# Patient Record
Sex: Female | Born: 1975 | Race: Black or African American | Hispanic: No | Marital: Single | Smoking: Former smoker
Health system: Southern US, Community
[De-identification: ages and names within clinical notes are randomized; demographics above are authoritative.]

## PROBLEM LIST (undated history)

## (undated) ENCOUNTER — Ambulatory Visit (HOSPITAL_COMMUNITY): Source: Home / Self Care

## (undated) DIAGNOSIS — E669 Obesity, unspecified: Secondary | ICD-10-CM

## (undated) DIAGNOSIS — IMO0002 Reserved for concepts with insufficient information to code with codable children: Secondary | ICD-10-CM

## (undated) DIAGNOSIS — D219 Benign neoplasm of connective and other soft tissue, unspecified: Secondary | ICD-10-CM

## (undated) DIAGNOSIS — D649 Anemia, unspecified: Secondary | ICD-10-CM

## (undated) DIAGNOSIS — N920 Excessive and frequent menstruation with regular cycle: Secondary | ICD-10-CM

## (undated) HISTORY — PX: TUBAL LIGATION: SHX77

---

## 1998-01-10 ENCOUNTER — Inpatient Hospital Stay (HOSPITAL_COMMUNITY): Admission: AD | Admit: 1998-01-10 | Discharge: 1998-01-17 | Payer: Self-pay | Admitting: *Deleted

## 1998-01-20 ENCOUNTER — Inpatient Hospital Stay (HOSPITAL_COMMUNITY): Admission: EM | Admit: 1998-01-20 | Discharge: 1998-01-20 | Payer: Self-pay | Admitting: Obstetrics & Gynecology

## 1998-03-21 ENCOUNTER — Inpatient Hospital Stay (HOSPITAL_COMMUNITY): Admission: RE | Admit: 1998-03-21 | Discharge: 1998-03-21 | Payer: Self-pay | Admitting: *Deleted

## 1998-06-29 ENCOUNTER — Emergency Department (HOSPITAL_COMMUNITY): Admission: EM | Admit: 1998-06-29 | Discharge: 1998-06-29 | Payer: Self-pay | Admitting: Emergency Medicine

## 1999-07-07 ENCOUNTER — Encounter: Admission: RE | Admit: 1999-07-07 | Discharge: 1999-07-07 | Payer: Self-pay | Admitting: Obstetrics & Gynecology

## 1999-07-24 ENCOUNTER — Ambulatory Visit (HOSPITAL_COMMUNITY): Admission: RE | Admit: 1999-07-24 | Discharge: 1999-07-24 | Payer: Self-pay | Admitting: Obstetrics & Gynecology

## 1999-08-05 ENCOUNTER — Inpatient Hospital Stay (HOSPITAL_COMMUNITY): Admission: AD | Admit: 1999-08-05 | Discharge: 1999-08-05 | Payer: Self-pay | Admitting: Obstetrics

## 1999-08-06 ENCOUNTER — Encounter: Payer: Self-pay | Admitting: Obstetrics & Gynecology

## 1999-08-06 ENCOUNTER — Ambulatory Visit (HOSPITAL_COMMUNITY): Admission: RE | Admit: 1999-08-06 | Discharge: 1999-08-06 | Payer: Self-pay | Admitting: Obstetrics & Gynecology

## 1999-09-17 ENCOUNTER — Encounter: Admission: RE | Admit: 1999-09-17 | Discharge: 1999-09-17 | Payer: Self-pay | Admitting: Emergency Medicine

## 1999-09-17 ENCOUNTER — Encounter: Payer: Self-pay | Admitting: Obstetrics

## 1999-09-17 ENCOUNTER — Ambulatory Visit (HOSPITAL_COMMUNITY): Admission: RE | Admit: 1999-09-17 | Discharge: 1999-09-17 | Payer: Self-pay | Admitting: Obstetrics

## 1999-10-14 ENCOUNTER — Encounter: Admission: RE | Admit: 1999-10-14 | Discharge: 1999-10-14 | Payer: Self-pay | Admitting: Obstetrics & Gynecology

## 1999-11-05 ENCOUNTER — Ambulatory Visit (HOSPITAL_COMMUNITY): Admission: RE | Admit: 1999-11-05 | Discharge: 1999-11-05 | Payer: Self-pay | Admitting: Obstetrics & Gynecology

## 1999-12-09 ENCOUNTER — Encounter: Admission: RE | Admit: 1999-12-09 | Discharge: 1999-12-09 | Payer: Self-pay | Admitting: Obstetrics & Gynecology

## 1999-12-30 ENCOUNTER — Encounter: Admission: RE | Admit: 1999-12-30 | Discharge: 1999-12-30 | Payer: Self-pay | Admitting: Obstetrics & Gynecology

## 2000-01-19 ENCOUNTER — Inpatient Hospital Stay (HOSPITAL_COMMUNITY): Admission: AD | Admit: 2000-01-19 | Discharge: 2000-01-19 | Payer: Self-pay | Admitting: *Deleted

## 2000-01-25 ENCOUNTER — Ambulatory Visit (HOSPITAL_COMMUNITY): Admission: RE | Admit: 2000-01-25 | Discharge: 2000-01-25 | Payer: Self-pay | Admitting: Obstetrics & Gynecology

## 2000-02-01 ENCOUNTER — Inpatient Hospital Stay (HOSPITAL_COMMUNITY): Admission: AD | Admit: 2000-02-01 | Discharge: 2000-02-01 | Payer: Self-pay | Admitting: *Deleted

## 2000-02-07 ENCOUNTER — Inpatient Hospital Stay (HOSPITAL_COMMUNITY): Admission: AD | Admit: 2000-02-07 | Discharge: 2000-02-07 | Payer: Self-pay | Admitting: Obstetrics & Gynecology

## 2000-02-10 ENCOUNTER — Encounter: Admission: RE | Admit: 2000-02-10 | Discharge: 2000-02-10 | Payer: Self-pay | Admitting: Obstetrics & Gynecology

## 2000-02-17 ENCOUNTER — Ambulatory Visit (HOSPITAL_COMMUNITY): Admission: RE | Admit: 2000-02-17 | Discharge: 2000-02-17 | Payer: Self-pay | Admitting: Obstetrics & Gynecology

## 2000-02-17 ENCOUNTER — Encounter: Admission: RE | Admit: 2000-02-17 | Discharge: 2000-02-17 | Payer: Self-pay | Admitting: Obstetrics & Gynecology

## 2000-02-24 ENCOUNTER — Encounter: Admission: RE | Admit: 2000-02-24 | Discharge: 2000-02-24 | Payer: Self-pay | Admitting: Obstetrics & Gynecology

## 2000-03-09 ENCOUNTER — Encounter: Admission: RE | Admit: 2000-03-09 | Discharge: 2000-03-09 | Payer: Self-pay | Admitting: Obstetrics & Gynecology

## 2000-03-10 ENCOUNTER — Inpatient Hospital Stay (HOSPITAL_COMMUNITY): Admission: AD | Admit: 2000-03-10 | Discharge: 2000-03-10 | Payer: Self-pay | Admitting: Obstetrics & Gynecology

## 2000-03-16 ENCOUNTER — Encounter: Admission: RE | Admit: 2000-03-16 | Discharge: 2000-03-16 | Payer: Self-pay | Admitting: Obstetrics & Gynecology

## 2000-03-20 ENCOUNTER — Encounter (INDEPENDENT_AMBULATORY_CARE_PROVIDER_SITE_OTHER): Payer: Self-pay | Admitting: Specialist

## 2000-03-20 ENCOUNTER — Inpatient Hospital Stay (HOSPITAL_COMMUNITY): Admission: AD | Admit: 2000-03-20 | Discharge: 2000-03-23 | Payer: Self-pay | Admitting: Obstetrics & Gynecology

## 2000-03-27 ENCOUNTER — Inpatient Hospital Stay (HOSPITAL_COMMUNITY): Admission: AD | Admit: 2000-03-27 | Discharge: 2000-03-27 | Payer: Self-pay | Admitting: Obstetrics & Gynecology

## 2000-05-12 ENCOUNTER — Encounter: Admission: RE | Admit: 2000-05-12 | Discharge: 2000-05-12 | Payer: Self-pay | Admitting: Internal Medicine

## 2001-04-17 ENCOUNTER — Emergency Department (HOSPITAL_COMMUNITY): Admission: EM | Admit: 2001-04-17 | Discharge: 2001-04-18 | Payer: Self-pay | Admitting: Emergency Medicine

## 2001-05-08 ENCOUNTER — Emergency Department (HOSPITAL_COMMUNITY): Admission: EM | Admit: 2001-05-08 | Discharge: 2001-05-09 | Payer: Self-pay | Admitting: Emergency Medicine

## 2001-07-16 ENCOUNTER — Emergency Department (HOSPITAL_COMMUNITY): Admission: EM | Admit: 2001-07-16 | Discharge: 2001-07-16 | Payer: Self-pay | Admitting: Emergency Medicine

## 2001-12-21 ENCOUNTER — Emergency Department (HOSPITAL_COMMUNITY): Admission: EM | Admit: 2001-12-21 | Discharge: 2001-12-21 | Payer: Self-pay | Admitting: *Deleted

## 2002-01-25 ENCOUNTER — Emergency Department (HOSPITAL_COMMUNITY): Admission: EM | Admit: 2002-01-25 | Discharge: 2002-01-25 | Payer: Self-pay | Admitting: Emergency Medicine

## 2002-03-24 ENCOUNTER — Emergency Department (HOSPITAL_COMMUNITY): Admission: EM | Admit: 2002-03-24 | Discharge: 2002-03-24 | Payer: Self-pay | Admitting: Emergency Medicine

## 2002-05-21 ENCOUNTER — Emergency Department (HOSPITAL_COMMUNITY): Admission: EM | Admit: 2002-05-21 | Discharge: 2002-05-21 | Payer: Self-pay | Admitting: Emergency Medicine

## 2003-08-12 ENCOUNTER — Emergency Department (HOSPITAL_COMMUNITY): Admission: EM | Admit: 2003-08-12 | Discharge: 2003-08-12 | Payer: Self-pay | Admitting: Emergency Medicine

## 2004-12-18 ENCOUNTER — Emergency Department (HOSPITAL_COMMUNITY): Admission: EM | Admit: 2004-12-18 | Discharge: 2004-12-18 | Payer: Self-pay | Admitting: Emergency Medicine

## 2006-09-03 ENCOUNTER — Emergency Department (HOSPITAL_COMMUNITY): Admission: EM | Admit: 2006-09-03 | Discharge: 2006-09-03 | Payer: Self-pay | Admitting: Emergency Medicine

## 2006-09-13 ENCOUNTER — Emergency Department (HOSPITAL_COMMUNITY): Admission: EM | Admit: 2006-09-13 | Discharge: 2006-09-13 | Payer: Self-pay | Admitting: Emergency Medicine

## 2007-01-23 ENCOUNTER — Emergency Department (HOSPITAL_COMMUNITY): Admission: EM | Admit: 2007-01-23 | Discharge: 2007-01-23 | Payer: Self-pay | Admitting: Emergency Medicine

## 2008-09-29 ENCOUNTER — Emergency Department (HOSPITAL_COMMUNITY): Admission: EM | Admit: 2008-09-29 | Discharge: 2008-09-29 | Payer: Self-pay | Admitting: Family Medicine

## 2010-05-27 ENCOUNTER — Emergency Department (HOSPITAL_COMMUNITY): Admission: EM | Admit: 2010-05-27 | Discharge: 2010-05-27 | Payer: Self-pay | Admitting: Family Medicine

## 2011-04-02 NOTE — Op Note (Signed)
[facility]  Patient:    Jordan Pruitt, Jordan Pruitt                      MRN: [id number] Proc. Date: 03/21/00 Adm. Date:  [id number] Attending:  Antionette Char                           Operative Report  PREOPERATIVE DIAGNOSIS:       Intrauterine pregnancy at 39 weeks with spontaneous rupture of membranes.  History of previous cesarean deliveries x 3.  POSTOPERATIVE DIAGNOSIS:      Intrauterine pregnancy at 39 weeks with spontaneous rupture of membranes.  History of previous cesarean deliveries x 3.  OPERATION:                    Caryl Ada low transverse cesarean section via Pfannenstiel and modified Pomeroy bilateral tubal ligation.  SURGEON:                      Roseanna Rainbow, M.D.  ASSISTANT:                    Bing Neighbors. Clearance Coots, M.D.  ANESTHESIA:                   Spinal.  COMPLICATIONS:                None.  ESTIMATED BLOOD LOSS:         800 cc.  FLUIDS:                       1500 cc of Ringers lactate.  URINE OUTPUT:                 300 cc of clear urine at the end of the procedure.  INDICATIONS:                  The patient is a 35 year old para 3 at 39+ weeks ho presented with spontaneous rupture of membranes without labor.  FINDINGS:                     Infant in cephalic presentation.  Pediatrics present at delivery.  Weight 5 pounds 15 ounces.  Normal uterus, tubes, and ovaries.  DESCRIPTION OF PROCEDURE:     The patient was taken to the operating room where a spinal anesthetic was administered and felt to be adequate.  She was then prepped and draped in the usual sterile fashion in the dorsal supine position with a leftward tilt.  A Pfannenstiel skin incision was then made through the previous  scar with a scalpel and carried through to the underlying layer of fascia with he Bovie.  The fascia was incised in the midline and the incision extended laterally with Mayo scissors.  The superior aspect of the fascial  incision was then grasped with Kocher clamps, elevated, and the underlying rectus muscles dissected off sharply.  Attention was then turned to the inferior aspect of this incision which was manipulated in a similar fashion.  The rectus muscles were separated in the  midline.  The parietoperitoneum was identified, tented up, and entered sharply ith Metzenbaum scissors.  The peritoneal incision was then extended superiorly and inferiorly with good visualization of the bladder.  The bladder blade was then inserted and the vesicouterine peritoneum identified, grasped with pickups, and  entered sharply with  Metzenbaum scissors.  This incision was then extended laterally and the bladder flap created sharply.  The bladder blade was then reinserted and the lower uterine segment incised in a transverse fashion with the scalpel.  The uterine incision was then extended laterally with bandage scissors. The bladder blade was removed and the infants head delivered atraumatically. The nose and mouth were suctioned with the bulb suction and the cord clamped and cut. The infant was handed off to the awaiting pediatricians.  The placenta was then  removed.  The uterus was exteriorized and cleared of all clots and debris.  The  uterine incision was repaired with 0 Monocryl in a running locking fashion.  At  this point, attention was turned to the fallopian tubes.  The left fallopian tube was grasped at the isthmic portion.  Two ligatures were placed around the loop f tube approximately 2 to 3 cm.  This portion of tube was then excised.  On the right, the fallopian tube was grasped and the window was made in the mesosalpinx in the isthmic portion.  Two ligatures were then placed and a segment of tube was hen excised measuring approximately 2 to 3 cm between these two ligatures. Excellent hemostasis was noted.  The uterus was returned to the abdomen.  The gutters were cleared of all clots.  The  fascia was reapproximated with 0 PDS in a running fashion.  The skin was closed with staples.  The patient tolerated the procedure well.  Sponge, needle, and instrument counts were correct x 2.  1 gram of Cefazolin was given at cord clamp.  The patient was taken to the PACU in stable condition. DD:  03/21/00 TD:  03/21/00 Job: [id number]

## 2011-04-02 NOTE — Discharge Summary (Signed)
[place]  Patient:    Jordan Pruitt, Jordan Pruitt                      MRN: [id number] Adm. Date:  [id number] Disc. Date: [id number] Attending:  Antionette Char Dictator:   Delano Metz, M.D.                           Discharge Summary  HISTORY OF PRESENT ILLNESS:  [name] is a 35 year old African-American female G5, P3-1-1-4 with a history of a C-section x 3 due to fetal distress. She presented for repeat C-section.  HOSPITAL COURSE:  The LTCS was performed on Mar 22, 2000.  In addition to the LTCS she had a BTL.  She delivered a viable female infant without any complications.  Apgars were 8 and 9 at one and five minutes.  CONDITION ON DISCHARGE:  The patient was discharged home in good condition. DD:  04/14/00 TD:  04/18/00 Job: [id number]

## 2011-06-24 ENCOUNTER — Inpatient Hospital Stay (INDEPENDENT_AMBULATORY_CARE_PROVIDER_SITE_OTHER)
Admission: RE | Admit: 2011-06-24 | Discharge: 2011-06-24 | Disposition: A | Discharge status: Home / Self Care | Payer: Self-pay | Source: Ambulatory Visit | Attending: Emergency Medicine | Admitting: Emergency Medicine

## 2011-06-24 DIAGNOSIS — S335XXA Sprain of ligaments of lumbar spine, initial encounter: Secondary | ICD-10-CM

## 2011-12-16 ENCOUNTER — Emergency Department (INDEPENDENT_AMBULATORY_CARE_PROVIDER_SITE_OTHER)
Admission: EM | Admit: 2011-12-16 | Discharge: 2011-12-16 | Disposition: A | Discharge status: Home / Self Care | Payer: Self-pay | Source: Home / Self Care | Attending: Family Medicine | Admitting: Family Medicine

## 2011-12-16 ENCOUNTER — Encounter (HOSPITAL_COMMUNITY): Payer: Self-pay

## 2011-12-16 DIAGNOSIS — IMO0002 Reserved for concepts with insufficient information to code with codable children: Secondary | ICD-10-CM

## 2011-12-16 MED ORDER — DOXYCYCLINE HYCLATE 100 MG PO CAPS: 100.0000 mg | ORAL_CAPSULE | Freq: Two times a day (BID) | ORAL | Status: AC

## 2011-12-16 MED ORDER — HYDROCODONE-ACETAMINOPHEN 5-325 MG PO TABS: ORAL_TABLET | ORAL | Status: DC

## 2011-12-16 NOTE — ED Provider Notes (Signed)
History     CSN: [id number]  Arrival date & time 12/16/11  7829   First MD Initiated Contact with Patient 12/16/11 1028      Chief Complaint  Patient presents with  . Hand Pain    (Consider location/radiation/quality/duration/timing/severity/associated sxs/prior treatment) HPI Comments: Pain ,swelling, redness to right middle finger tip x 3 days. Reports she had a hangnail that she pulled off with nail clippers. Pain kept her from sleeping last pm  The history is provided by the patient.    History reviewed. No pertinent past medical history.  Past Surgical History  Procedure Date  . Cesarean section   . Tubal ligation     History reviewed. No pertinent family history.  History  Substance Use Topics  . Smoking status: Current Everyday Smoker  . Smokeless tobacco: Not on file  . Alcohol Use: Yes    OB History    Grav Para Term Preterm Abortions TAB SAB Ect Mult Living                  Review of Systems  Constitutional: Negative.   Cardiovascular: Negative.   Gastrointestinal: Negative.   Genitourinary: Negative.     Allergies  Review of patient's allergies indicates no known allergies.  Home Medications   Current Outpatient Rx  Name Route Sig Dispense Refill  . DOXYCYCLINE HYCLATE 100 MG PO CAPS Oral Take 1 capsule (100 mg total) by mouth 2 (two) times daily. 20 capsule 0  . HYDROCODONE-ACETAMINOPHEN 5-325 MG PO TABS  1 tab q 8 hrs prn pain 10 tablet 0    BP 132/91  Pulse 68  Temp(Src) 99 F (37.2 C) (Oral)  Resp 17  SpO2 100%  LMP 12/10/2011  Physical Exam  Nursing note and vitals reviewed. Constitutional: She appears well-developed and well-nourished.  Cardiovascular: Normal rate and regular rhythm.   Pulmonary/Chest: Effort normal and breath sounds normal.  Musculoskeletal:       Patient has a paronychia right middle finger. Opened with steril needle. Mod amt of purulent discharge expressed. Dressing applied    ED Course  Procedures  (including critical care time)  Labs Reviewed - No data to display No results found.   1. Paronychia       MDM          Randa Spike, MD 12/16/11 1054

## 2011-12-16 NOTE — ED Notes (Signed)
C/o swelling, redness, and pain to rt middle finger for 3 days.  Reports she had a hangnail that she pulled off with nail clippers.

## 2012-01-03 ENCOUNTER — Encounter (HOSPITAL_COMMUNITY): Payer: Self-pay | Admitting: *Deleted

## 2012-01-03 ENCOUNTER — Emergency Department (INDEPENDENT_AMBULATORY_CARE_PROVIDER_SITE_OTHER)
Admission: EM | Admit: 2012-01-03 | Discharge: 2012-01-03 | Disposition: A | Discharge status: Home / Self Care | Payer: Self-pay | Source: Home / Self Care

## 2012-01-03 DIAGNOSIS — L0291 Cutaneous abscess, unspecified: Secondary | ICD-10-CM

## 2012-01-03 DIAGNOSIS — L0390 Cellulitis, unspecified: Secondary | ICD-10-CM

## 2012-01-03 MED ORDER — TRAMADOL HCL 50 MG PO TABS: 50.0000 mg | ORAL_TABLET | Freq: Four times a day (QID) | ORAL | Status: AC | PRN

## 2012-01-03 MED ORDER — DOXYCYCLINE HYCLATE 100 MG PO TABS: 100.0000 mg | ORAL_TABLET | Freq: Two times a day (BID) | ORAL | Status: AC

## 2012-01-03 NOTE — Discharge Instructions (Signed)
RESOURCE GUIDE  Dental Problems  Patients with Medicaid: [facility]                     [facility] [address]                                           [address] Phone:  [phone]                                                  Phone:  [phone]  If unable to pay or uninsured, contact:  [facility] or [facility] to become qualified for the adult dental clinic.  Chronic Pain Problems Contact [facility]  [phone] Patients need to be referred by their primary care doctor.  Insufficient Money for Medicine Contact [employer]:  call "211" or [facility] 271-5999.  No Primary Care Doctor Call [facility]  832-8000 Other agencies that provide inexpensive medical care    [facility]  [phone]    [facility]  [phone]    [facility]  [phone]    Women's Clinic  832-4777    [facility]  [phone]    [facility]  [phone]  Psychological Services [facility]  [phone] [facility]  [phone] [facility]   800 853-5163 (emergency services 641-4993)  Substance Abuse Resources [facility]  336-882-2125 [facility] 336-784-9470 [facility] 336-285-9073 [facility] 336-845-3988 Residential & Outpatient Substance Abuse Program  800-659-3381  Abuse/Neglect [place] Child Abuse Hotline (336) 641-3795 [place] Child Abuse Hotline 800-378-5315 (After Hours)  Emergency Shelter [facility] (336) 271-5985  Maternity Homes [facility] (336) 275-9566 [facility] (704) 372-4663  MRSA Hotline #:   832-7006    [place] [facility]  [facility] [place]     [employer]                          [facility] [address] [place]                       [address]      [address]  [place]                                                [place]                            [place] Phone:  [phone]                                   Phone:  [phone]                 Phone:  [phone]  [facility] Phone:  [phone]    [facility] Child Abuse Hotline 289-803-4633 (214)616-1911 (After Hours)  Abscess An abscess (boil or furuncle) is an infected area that contains a collection of pus.  SYMPTOMS Signs and symptoms of an abscess include pain, tenderness, redness, or hardness. You may feel a moveable soft area under your skin. An abscess can occur anywhere in the body.  TREATMENT  A surgical cut (incision) may be made over your abscess to drain the pus. Gauze may be packed into the space or a drain may be looped through the abscess cavity (pocket). This provides a drain that will allow the cavity to heal from the inside outwards. The abscess may be painful for a few days, but should feel much better if it was drained.  Your abscess, if seen early, may not have localized and may not have been drained. If not, another appointment may be required if it does not get better on its own or with medications. HOME CARE INSTRUCTIONS   Only take over-the-counter or prescription medicines for pain, discomfort, or fever as directed by your caregiver.   Take your antibiotics as directed if they were prescribed. Finish them even if you start to feel better.   Keep the skin and clothes clean around your abscess.   If the abscess was drained, you will need to use gauze dressing to collect any draining pus. Dressings will typically need to be changed 3 or more times a day.   The infection may spread by skin contact with others. Avoid skin contact as much as possible.   Practice good hygiene. This includes regular hand washing, cover any draining skin lesions, and do not share personal care items.   If you participate in sports, do not  share athletic equipment, towels, whirlpools, or personal care items. Shower after every practice or tournament.   If a draining area cannot be adequately covered:   Do not participate in sports.   Children should not participate in day care until the wound has healed or drainage stops.   If your caregiver has given you a follow-up appointment, it is very important to keep that appointment. Not keeping the appointment could result in a much worse infection, chronic or permanent injury, pain, and disability. If there is any problem keeping the appointment, you must call back to this facility for assistance.  SEEK MEDICAL CARE IF:   You develop increased pain, swelling, redness, drainage, or bleeding in the wound site.   You develop signs of generalized infection including muscle aches, chills, fever, or a general ill feeling.   You have an oral temperature above 102 F (38.9 C).  MAKE SURE YOU:   Understand these instructions.   Will watch your condition.   Will get help right away if you are not doing well or get worse.  Document Released: 08/11/2005 Document Revised: 07/14/2011 Document Reviewed: 06/04/2008 [facility] Patient Information 2012 [place], [place].

## 2012-01-03 NOTE — ED Notes (Signed)
Noticed "boil" to left lower abd 2 days ago.  Last night area started to peel, become red, warm, and irritated.  Has been applying warm compresses, taking ASA, and Tyl PM at night.

## 2012-01-04 NOTE — Progress Notes (Signed)
Jordan Pruitt is a 36 y.o. female who presents to Urgent Care today for boil noted on Left abdominal wall for past 2 days.  Painful and tender to touch.  Has had some spontaneous drainage from site.  Has history of this in past, needing to be drained.  No fevers, chills, nausea, abdominal pain, or vomiting.     PMH reviewed.  ROS as above otherwise neg Medications reviewed. No current facility-administered medications for this encounter.   Current Outpatient Prescriptions  Medication Sig Dispense Refill  . doxycycline (VIBRA-TABS) 100 MG tablet Take 1 tablet (100 mg total) by mouth 2 (two) times daily. X 7 days  14 tablet  0  . HYDROcodone-acetaminophen (NORCO) 5-325 MG per tablet 1 tab q 8 hrs prn pain  10 tablet  0  . traMADol (ULTRAM) 50 MG tablet Take 1 tablet (50 mg total) by mouth every 6 (six) hours as needed for pain.  25 tablet  0    Exam:  BP 145/92  Pulse 96  Temp(Src) 99.2 F (37.3 C) (Oral)  Resp 18  SpO2 99%  LMP 12/18/2011 Gen: Well NAD Lungs: CTABL Nl WOB Heart: RRR no MRG Abd: NABS, NT, ND Exts: Non edematous BL  LE, warm and well perfused.  Skin:  1 cm Abscess noted LLQ abdominal wall.  Surrounding area of erythema about 5 cm in diameter.  Very TTP.  Rubor noted.    Abscess drained -- Procedure note: Informed consent given and signed copy in chart. Appropriate time out taken. Area prepped and draped in usual sterile fashion. Local anasthesia with 2% lidocaine with epi (8 cc). Small 1 cm incision made horizontally across center of abscess.  Purulent material expressed immediately, able to express more with pressure.  Hemostat used to break up loculations within wound.  Iodoform dressing packed into wound.  Area was cleaned and topical abx ointment applied. Bandage applied.  No need for suture. No complications. EBL < 5 cc.   Assessment and Plan: 1.  Abscess:  Drained in Urgent Care.  Pt tolerated procedure well.  Provided Tramadol for pain relief and Doxycycline  due to overlying skin cellulitis.  Non-diabetic, likely MRSA infection.  To FU in 5 days if recurs or no improvement.  Patient very grateful, experienced immediate relief of pain after drainage

## 2012-01-06 NOTE — ED Provider Notes (Signed)
Author:  Renold Don, MD  Service:  (none)  Author Type:  Resident   Filed:  01/04/12 2041  Note Time:  01/04/12 2010  Cosign Required:  Yes       Jordan Pruitt is a 35 y.o. female who presents to Urgent Care today for boil noted on Left abdominal wall for past 2 days. Painful and tender to touch. Has had some spontaneous drainage from site. Has history of this in past, needing to be drained. No fevers, chills, nausea, abdominal pain, or vomiting.  PMH reviewed.  ROS as above otherwise neg  Medications reviewed.    No current facility-administered medications for this encounter.    Current Outpatient Prescriptions     Medication  Sig  Dispense  Refill     .  doxycycline (VIBRA-TABS) 100 MG tablet  Take 1 tablet (100 mg total) by mouth 2 (two) times daily. X 7 days  14 tablet  0     .  HYDROcodone-acetaminophen (NORCO) 5-325 MG per tablet  1 tab q 8 hrs prn pain  10 tablet  0     .  traMADol (ULTRAM) 50 MG tablet  Take 1 tablet (50 mg total) by mouth every 6 (six) hours as needed for pain.  25 tablet  0     Exam:  BP 145/92  Pulse 96  Temp(Src) 99.2 F (37.3 C) (Oral)  Resp 18  SpO2 99%  LMP 12/18/2011  Gen: Well NAD  Lungs: CTABL Nl WOB  Heart: RRR no MRG  Abd: NABS, NT, ND  Exts: Non edematous BL LE, warm and well perfused.  Skin: 1 cm Abscess noted LLQ abdominal wall. Surrounding area of erythema about 5 cm in diameter. Very TTP. Rubor noted.  Abscess drained -- Procedure note:  Informed consent given and signed copy in chart. Appropriate time out taken. Area prepped and draped in usual sterile fashion. Local anasthesia with 2% lidocaine with epi (8 cc). Small 1 cm incision made horizontally across center of abscess. Purulent material expressed immediately, able to express more with pressure. Hemostat used to break up loculations within wound. Iodoform dressing packed into wound. Area was cleaned and topical abx ointment applied. Bandage applied. No need for suture. No  complications. EBL < 5 cc.  Assessment and Plan:  1. Abscess: Drained in Urgent Care. Pt tolerated procedure well. Provided Tramadol for pain relief and Doxycycline due to overlying skin cellulitis. Non-diabetic, likely MRSA infection. To FU in 5 days if recurs or no improvement. Patient very grateful, experienced immediate relief of pain after drainage

## 2012-01-11 NOTE — ED Provider Notes (Signed)
Medical screening examination/treatment/procedure(s) were performed by PGY-3 FM resident and as supervising physician I was immediately available for consultation/collaboration.   Sharin Grave, MD   Sharin Grave, MD 01/11/12 [phone]

## 2012-01-16 NOTE — Progress Notes (Signed)
Medical screening examination/treatment/procedure(s) were performed by resident physician and as supervising physician I was immediately available for consultation/collaboration.  Ronnie Laney, MD 

## 2012-03-11 ENCOUNTER — Encounter (HOSPITAL_COMMUNITY): Payer: Self-pay | Admitting: *Deleted

## 2012-03-11 ENCOUNTER — Emergency Department (INDEPENDENT_AMBULATORY_CARE_PROVIDER_SITE_OTHER)
Admission: EM | Admit: 2012-03-11 | Discharge: 2012-03-11 | Disposition: A | Discharge status: Home / Self Care | Payer: Self-pay | Source: Home / Self Care | Attending: Emergency Medicine | Admitting: Emergency Medicine

## 2012-03-11 DIAGNOSIS — IMO0002 Reserved for concepts with insufficient information to code with codable children: Secondary | ICD-10-CM

## 2012-03-11 HISTORY — DX: Reserved for concepts with insufficient information to code with codable children: IMO0002

## 2012-03-11 MED ORDER — TRAMADOL HCL 50 MG PO TABS: 100.0000 mg | ORAL_TABLET | Freq: Three times a day (TID) | ORAL | Status: AC | PRN

## 2012-03-11 MED ORDER — CEPHALEXIN 500 MG PO CAPS: 500.0000 mg | ORAL_CAPSULE | Freq: Three times a day (TID) | ORAL | Status: AC

## 2012-03-11 NOTE — ED Notes (Signed)
C/O swelling to cuticle area of right 4th finger x 2 days.  Has hx of paronychia.  Has not been soaking finger or applying warm compresses.

## 2012-03-11 NOTE — ED Provider Notes (Signed)
Chief Complaint  Patient presents with  . Nail Problem    History of Present Illness:   The patient is a 36 year old female who has an infected nail fold of the right ring finger for the past 3 days. She's had these before in various fingers. She attributes some to wearing acrylic nails, but she had all her acrylic nails removed. The pain and swelling is confined to the nailfold and does not extend down the finger. She is able to move her finger well and denies any fever chills.  Review of Systems:  Other than noted above, the patient denies any of the following symptoms: Systemic:  No fevers, chills, sweats, or aches.  No fatigue or tiredness. Musculoskeletal:  No joint pain, arthritis, bursitis, swelling, back pain, or neck pain. Neurological:  No muscular weakness, paresthesias, headache, or trouble with speech or coordination.  No dizziness.   PMFSH:  Past medical history, family history, social history, meds, and allergies were reviewed.  Physical Exam:   Vital signs:  BP 128/71  Pulse 73  Temp(Src) 98.5 F (36.9 C) (Oral)  Resp 18  SpO2 100%  LMP 02/28/2012 Gen:  Alert and oriented times 3.  In no distress. Musculoskeletal: The nailfold on the right ring finger has a small collection of pus. This was red and inflamed. There was no pus under the nail. No pain and swelling over the distal phalanx, PIP, DIP, or mid or proximal phalanx. She is able to bend at and straighten her finger without any difficulty. Otherwise, all joints had a full a ROM with no swelling, bruising or deformity.  No edema, pulses full. Extremities were warm and pink.  Capillary refill was brisk.  Skin:  Clear, warm and dry.  No rash. Neuro:  Alert and oriented times 3.  Muscle strength was normal.  Sensation was intact to light touch.   Procedure Note:  Verbal informed consent was obtained from the patient.  Risks and benefits were outlined with the patient.  Patient understands and accepts these risks.   Identity of the patient was confirmed verbally and by armband.    Procedure was performed as followed:  The finger was prepped with alcohol and the colonic pus was incised draining out a couple drops of pus. This was cultured. Antibiotic ointment and a Band-Aid dressing were applied the patient was instructed in wound care.  Patient tolerated the procedure well without any immediate complications.  Assessment:  The encounter diagnosis was Paronychia.  Plan:   1.  The following meds were prescribed:   New Prescriptions   CEPHALEXIN (KEFLEX) 500 MG CAPSULE    Take 1 capsule (500 mg total) by mouth 3 (three) times daily.   TRAMADOL (ULTRAM) 50 MG TABLET    Take 2 tablets (100 mg total) by mouth every 8 (eight) hours as needed for pain.   2.  The patient was instructed in symptomatic care, including rest and activity, elevation, application of ice and compression.  Appropriate handouts were given. 3.  The patient was told to return if becoming worse in any way, if no better in 3 or 4 days, and given some red flag symptoms that would indicate earlier return.   4.  The patient was told to follow up here if no better in 2 or 3 days.   Reuben Likes, MD 03/11/12 2120

## 2012-03-11 NOTE — Discharge Instructions (Signed)
Soak in warm water with Epsom salts 4 times daily for 10 to 15 minutes.  Apply antibiotic ointment and BandAid.

## 2012-03-15 LAB — CULTURE, ROUTINE-ABSCESS

## 2012-03-16 ENCOUNTER — Telehealth (HOSPITAL_COMMUNITY): Payer: Self-pay | Admitting: *Deleted

## 2012-03-16 MED ORDER — SULFAMETHOXAZOLE-TRIMETHOPRIM 800-160 MG PO TABS: 1.0000 | ORAL_TABLET | Freq: Two times a day (BID) | ORAL | Status: AC

## 2012-03-16 NOTE — ED Notes (Signed)
Abscess culture R ring finger: Mod. MRSA. Pt. treated with Keflex and had an I and D.  Lab shown to Dr. Lorenza Chick.  He said to call for clinical improvement.  I called pt. Pt. verified x 2 and given results.  Pt. said her finger is still draining and sore. NKDA. Dr. Lorenza Chick notified and he e-prescribed Septra DS BID # 20 to her pharmacy.  Pt. told to stop the Keflex and start the Septra.  Pt. given MRSA instructions and questions answered. Pt. instructed to call the pharmacy in an hour to see if it is ready. Pt. could not remember her preferred pharmacy. When I looked up the order I noted that it said #10. I clarified with Dr. Lorenza Chick. He said it pre-populated that amt. and wants me to call the pharmacy and change it to # 20.  Pharmacist @ [phone] notified. Vassie Moselle 03/16/2012

## 2013-02-07 ENCOUNTER — Emergency Department (INDEPENDENT_AMBULATORY_CARE_PROVIDER_SITE_OTHER)
Admission: EM | Admit: 2013-02-07 | Discharge: 2013-02-07 | Disposition: A | Discharge status: Home / Self Care | Payer: Self-pay | Source: Home / Self Care | Attending: Family Medicine | Admitting: Family Medicine

## 2013-02-07 ENCOUNTER — Encounter (HOSPITAL_COMMUNITY): Payer: Self-pay

## 2013-02-07 DIAGNOSIS — Z23 Encounter for immunization: Secondary | ICD-10-CM

## 2013-02-07 DIAGNOSIS — H00014 Hordeolum externum left upper eyelid: Secondary | ICD-10-CM

## 2013-02-07 DIAGNOSIS — H00019 Hordeolum externum unspecified eye, unspecified eyelid: Secondary | ICD-10-CM

## 2013-02-07 MED ORDER — CHLORHEXIDINE GLUCONATE 4 % EX LIQD: 60.0000 mL | Freq: Every day | CUTANEOUS | Status: DC | PRN

## 2013-02-07 MED ORDER — ERYTHROMYCIN 5 MG/GM OP OINT: TOPICAL_OINTMENT | Freq: Four times a day (QID) | OPHTHALMIC | Status: DC

## 2013-02-07 MED ORDER — TETANUS-DIPHTH-ACELL PERTUSSIS 5-2.5-18.5 LF-MCG/0.5 IM SUSP
0.5000 mL | Freq: Once | INTRAMUSCULAR | Status: AC
Start: 2013-02-07 — End: 2013-02-07
  Administered 2013-02-07: 0.5 mL via INTRAMUSCULAR

## 2013-02-07 MED ORDER — SULFAMETHOXAZOLE-TRIMETHOPRIM 800-160 MG PO TABS: 1.0000 | ORAL_TABLET | Freq: Two times a day (BID) | ORAL | Status: DC

## 2013-02-07 MED ORDER — TETANUS-DIPHTH-ACELL PERTUSSIS 5-2.5-18.5 LF-MCG/0.5 IM SUSP
INTRAMUSCULAR | Status: AC
  Filled 2013-02-07: qty 0.5

## 2013-02-07 NOTE — ED Notes (Signed)
I THINK I HAVE A STYE

## 2013-02-07 NOTE — ED Provider Notes (Signed)
History     CSN: [id number]  Arrival date & time 02/07/13  9562   First MD Initiated Contact with Patient 02/07/13 1038      Chief Complaint  Patient presents with  . Eye Problem    (Consider location/radiation/quality/duration/timing/severity/associated sxs/prior treatment) HPI Comments: Pt thinks she has a sty  Patient is a 37 y.o. female presenting with eye problem. The history is provided by the patient.  Eye Problem Location:  R eye Quality: pain in lump in upper eyelid. Severity:  Severe Onset quality:  Gradual Duration:  4 days Timing:  Constant Progression:  Worsening Chronicity:  New Relieved by:  Nothing Exacerbated by: palpation of lump. Ineffective treatments: constant warm compresses. Associated symptoms: no blurred vision, no discharge, no photophobia and no redness     Past Medical History  Diagnosis Date  . Paronychia     Past Surgical History  Procedure Laterality Date  . Tubal ligation    . Cesarean section      x4    History reviewed. No pertinent family history.  History  Substance Use Topics  . Smoking status: Current Every Day Smoker -- 0.50 packs/day    Types: Cigarettes  . Smokeless tobacco: Not on file  . Alcohol Use: Yes    OB History   Grav Para Term Preterm Abortions TAB SAB Ect Mult Living                  Review of Systems  Constitutional: Negative for fever and chills.  Eyes: Negative for blurred vision, photophobia, discharge and redness.       Lump in r upper eyelid that is painful, not draining    Allergies  Ibuprofen  Home Medications   Current Outpatient Rx  Name  Route  Sig  Dispense  Refill  . chlorhexidine (HIBICLENS) 4 % external liquid   Topical   Apply 60 mLs (4 application total) topically daily as needed.   473 mL   0   . erythromycin ophthalmic ointment   Right Eye   Place into the right eye every 6 (six) hours. Place a 1/2 inch ribbon of ointment into the lower eyelid.   3.5 g   0   .  sulfamethoxazole-trimethoprim (SEPTRA DS) 800-160 MG per tablet   Oral   Take 1 tablet by mouth every 12 (twelve) hours.   20 tablet   0     BP 156/69  Pulse 91  Temp(Src) 98.3 F (36.8 C) (Oral)  Resp 16  SpO2 100%  Physical Exam  Constitutional: She appears well-developed and well-nourished.  Appears uncomfortable  Eyes: Conjunctivae and EOM are normal. Pupils are equal, round, and reactive to light. Right eye exhibits hordeolum. Right eye exhibits no discharge and no exudate. Left eye exhibits no discharge, no exudate and no hordeolum.      ED Course  Procedures (including critical care time)  Labs Reviewed - No data to display No results found.   1. Hordeolum externum of left upper eyelid       MDM  Dr. Tressia Danas saw pt with me. Small pinpoint area inferior aspect of hordeolum appears ready to open and drain. Dr. Alfonse Ras used a blunted 18 gauge needle to remove superficial skin and small amount of pus drained.  Pt to continue warm compresses.  Rx bactrim and erythromycin ointment and chlorhexidene for washing skin as patient has hx skin abscesses.  Pt to f/u with opthamologist if no resolution or worsening.  Cathlyn Parsons, NP 02/07/13 1100

## 2013-02-08 NOTE — ED Provider Notes (Signed)
Medical screening examination/treatment/procedure(s) were performed by non-physician practitioner and as supervising physician I was immediately available for consultation/collaboration.   MORENO-COLL,Sadler Teschner; MD  Robyn Nohr Moreno-Coll, MD 02/08/13 1021 

## 2013-09-13 ENCOUNTER — Encounter (HOSPITAL_COMMUNITY): Payer: Self-pay | Admitting: Emergency Medicine

## 2013-09-13 ENCOUNTER — Emergency Department (HOSPITAL_COMMUNITY)
Admission: EM | Admit: 2013-09-13 | Discharge: 2013-09-13 | Disposition: A | Discharge status: Home / Self Care | Payer: Self-pay | Attending: Emergency Medicine | Admitting: Emergency Medicine

## 2013-09-13 DIAGNOSIS — L02619 Cutaneous abscess of unspecified foot: Secondary | ICD-10-CM | POA: Insufficient documentation

## 2013-09-13 DIAGNOSIS — M7989 Other specified soft tissue disorders: Secondary | ICD-10-CM

## 2013-09-13 DIAGNOSIS — F17200 Nicotine dependence, unspecified, uncomplicated: Secondary | ICD-10-CM | POA: Insufficient documentation

## 2013-09-13 DIAGNOSIS — L0291 Cutaneous abscess, unspecified: Secondary | ICD-10-CM

## 2013-09-13 MED ORDER — SULFAMETHOXAZOLE-TRIMETHOPRIM 800-160 MG PO TABS: 1.0000 | ORAL_TABLET | Freq: Two times a day (BID) | ORAL | Status: DC

## 2013-09-13 MED ORDER — HYDROCODONE-ACETAMINOPHEN 5-325 MG PO TABS: 1.0000 | ORAL_TABLET | ORAL | Status: DC | PRN

## 2013-09-13 NOTE — ED Notes (Signed)
Pt states she had an insect bite to L ankle.  Since then she is experiencing pain and swelling to L foot.

## 2013-09-13 NOTE — ED Provider Notes (Signed)
CSN: [id number]     Arrival date & time 09/13/13  1505 History   First MD Initiated Contact with Patient 09/13/13 1612     Chief Complaint  Patient presents with  . Foot Pain   (Consider location/radiation/quality/duration/timing/severity/associated sxs/prior Treatment) The history is provided by the patient and medical records.   This is a 37 year old female with no significant past medical history presenting to the ED for left foot swelling and pain. Patient states 2 days ago she noticed a small reddened area to her left dorsal foot. She thought she been bitten by an insect but does not recall seeing any insects on her. Since then has had increased swelling, pain, and warmth to touch of left dorsal foot. 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 Denies any recent fever, sweats, or chills. Denies any numbness or paresthesias of foot. Has tried elevating her foot without improvement of swelling.  Pt states she is required to wear steel toed boots at work and think this has irritated her foot even further.  Past Medical History  Diagnosis Date  . Paronychia    Past Surgical History  Procedure Laterality Date  . Tubal ligation    . Cesarean section      x4   No family history on file. History  Substance Use Topics  . Smoking status: Current Every Day Smoker -- 0.50 packs/day    Types: Cigarettes  . Smokeless tobacco: Not on file  . Alcohol Use: Yes   OB History   Grav Para Term Preterm Abortions TAB SAB Ect Mult Living                 Review of Systems  Skin:       abscess  All other systems reviewed and are negative.    Allergies  Ibuprofen  Home Medications   Current Outpatient Rx  Name  Route  Sig  Dispense  Refill  . Prenatal Vit-Fe Fumarate-FA (PRENATAL MULTIVITAMIN) TABS tablet   Oral   Take 1 tablet by mouth daily at 12 noon.         . vitamin B-12 (CYANOCOBALAMIN) 1000 MCG tablet   Oral   Take  1,000 mcg by mouth daily.          BP 147/68  Pulse 86  Temp(Src) 98.1 F (36.7 C) (Oral)  Resp 18  SpO2 100%  LMP 08/30/2013  Physical Exam  Nursing note and vitals reviewed. Constitutional: She is oriented to person, place, and time. She appears well-developed and well-nourished. No distress.  HENT:  Head: Normocephalic and atraumatic.  Mouth/Throat: Oropharynx is clear and moist.  Eyes: Conjunctivae and EOM are normal. Pupils are equal, round, and reactive to light.  Neck: Normal range of motion. Neck supple.  Cardiovascular: Normal rate, regular rhythm and normal heart sounds.   Pulmonary/Chest: Effort normal and breath sounds normal. No respiratory distress. She has no wheezes.  Musculoskeletal: Normal range of motion.       Feet:  No bites identified; Moves all toes appropriately; strong distal pulse and cap refill; sensation intact  Neurological: She is alert and oriented to person, place, and time.  Skin: Skin is warm and dry. She is not diaphoretic.  Psychiatric: She has a normal mood and affect.    ED Course  Procedures (including critical care time) Labs Review Labs Reviewed - No data to display Imaging Review No results found.  EKG Interpretation   None  MDM   1. Abscess   2. Foot swelling    Small nondraining abscess of left foot not requiring I&D at this time. Given location of abscess will start course of abx. Instructed patient to continue elevating her foot and apply warm compresses. Monitor for increased signs of infection including increased redness, warmth, and drainage. Pt does not have PCP at this time-- resource guide given.  Return precautions advised.  Garlon Hatchet, PA-C 09/13/13 [phone]

## 2013-09-14 NOTE — ED Provider Notes (Signed)
Medical screening examination/treatment/procedure(s) were performed by non-physician practitioner and as supervising physician I was immediately available for consultation/collaboration.  Maleiah Dula L Silver Parkey, MD 09/14/13 0009 

## 2013-09-18 ENCOUNTER — Encounter (HOSPITAL_COMMUNITY): Payer: Self-pay | Admitting: Emergency Medicine

## 2013-09-18 ENCOUNTER — Emergency Department (HOSPITAL_COMMUNITY)
Admission: EM | Admit: 2013-09-18 | Discharge: 2013-09-18 | Disposition: A | Discharge status: Home / Self Care | Payer: Self-pay | Attending: Emergency Medicine | Admitting: Emergency Medicine

## 2013-09-18 DIAGNOSIS — L02619 Cutaneous abscess of unspecified foot: Secondary | ICD-10-CM | POA: Insufficient documentation

## 2013-09-18 DIAGNOSIS — L02612 Cutaneous abscess of left foot: Secondary | ICD-10-CM

## 2013-09-18 DIAGNOSIS — Z79899 Other long term (current) drug therapy: Secondary | ICD-10-CM | POA: Insufficient documentation

## 2013-09-18 DIAGNOSIS — F17200 Nicotine dependence, unspecified, uncomplicated: Secondary | ICD-10-CM | POA: Insufficient documentation

## 2013-09-18 MED ORDER — SULFAMETHOXAZOLE-TRIMETHOPRIM 800-160 MG PO TABS: 1.0000 | ORAL_TABLET | Freq: Two times a day (BID) | ORAL | Status: DC

## 2013-09-18 MED ORDER — HYDROCODONE-ACETAMINOPHEN 5-325 MG PO TABS: 1.0000 | ORAL_TABLET | Freq: Four times a day (QID) | ORAL | Status: DC | PRN

## 2013-09-18 MED ORDER — CEPHALEXIN 500 MG PO CAPS: 500.0000 mg | ORAL_CAPSULE | Freq: Four times a day (QID) | ORAL | Status: DC

## 2013-09-18 NOTE — ED Notes (Signed)
Pt has abcess on the top of her left foot and it is still bothering her.  Pt is on antibiotics and she is here for recheck of this.  Pt states that the area around abcess is swollen and red.  Pt denies any fever or chills.

## 2013-09-18 NOTE — ED Notes (Signed)
MD at bedside.kirichenke PAC at bedside for treatment of LT foot.

## 2013-09-18 NOTE — ED Provider Notes (Signed)
CSN: [id number]     Arrival date & time 09/18/13  1712 History  This chart was scribed for non-physician practitioner Jaynie Crumble, PA, working with Derwood Kaplan, MD by Ronal Fear, ED scribe. This patient was seen in room TR04C/TR04C and the patient's care was started at 5:58PM.'  Chief Complaint  Patient presents with  . Foot Pain    Patient is a 37 y.o. female presenting with abscess. The history is provided by the patient. No language interpreter was used.  Abscess Location:  Foot Foot abscess location:  Top of L foot Abscess quality: painful and redness   Red streaking: yes   Duration:  5 days Progression:  Worsening Pain details:    Quality:  Throbbing   Severity:  Mild   Duration:  3 days   Timing:  Constant   Progression:  Worsening Chronicity:  Recurrent Context: skin injury   Relieved by:  Nothing Worsened by:  Nothing tried Ineffective treatments:  None tried Associated symptoms: no nausea and no vomiting   Risk factors: prior abscess   HPI Comments: Jordan Pruitt is a 37 y.o. female with a hx of abscesses who presents to the Emergency Department complaining of abscess to the top left of her foot onset 5x days ago. She was seen 5x days ago for a prior occurence to the left foot. She was given abx with some relief. She currently has two abscess, one is getting better the other is coming to a head and getting worse.  She does not appear to be in any acute distress with no other complaints.    Past Medical History  Diagnosis Date  . Paronychia    Past Surgical History  Procedure Laterality Date  . Tubal ligation    . Cesarean section      x4   No family history on file. History  Substance Use Topics  . Smoking status: Current Every Day Smoker -- 0.50 packs/day    Types: Cigarettes  . Smokeless tobacco: Not on file  . Alcohol Use: Yes   OB History   Grav Para Term Preterm Abortions TAB SAB Ect Mult Living                 Review of Systems   Gastrointestinal: Negative for nausea and vomiting.  All other systems reviewed and are negative.    Allergies  Ibuprofen  Home Medications   Current Outpatient Rx  Name  Route  Sig  Dispense  Refill  . HYDROcodone-acetaminophen (NORCO/VICODIN) 5-325 MG per tablet   Oral   Take 1 tablet by mouth every 4 (four) hours as needed for pain.   10 tablet   0   . Prenatal Vit-Fe Fumarate-FA (PRENATAL MULTIVITAMIN) TABS tablet   Oral   Take 1 tablet by mouth daily at 12 noon.         . sulfamethoxazole-trimethoprim (SEPTRA DS) 800-160 MG per tablet   Oral   Take 1 tablet by mouth every 12 (twelve) hours.   20 tablet   0   . vitamin B-12 (CYANOCOBALAMIN) 1000 MCG tablet   Oral   Take 1,000 mcg by mouth daily.          BP 142/64  Pulse 104  Temp(Src) 98.3 F (36.8 C) (Oral)  Resp 22  Ht 5\' 3"  (1.6 m)  Wt 241 lb (109.317 kg)  BMI 42.70 kg/m2  SpO2 100%  LMP 08/30/2013 Physical Exam  Nursing note and vitals reviewed. Constitutional: She appears well-developed and well-nourished.  No distress.  Eyes: Conjunctivae are normal.  Neck: Neck supple.  Musculoskeletal:  4 x 4 centimeters abscess to left dorsal foot. Area is erythematous, warm to the touch, indurated, fluctuant. No surrounding erythema or tenderness.  Neurological: She is alert.  Skin: Skin is warm and dry.    ED Course  Procedures (including critical care time) DIAGNOSTIC STUDIES: Oxygen Saturation is 100% on RA, normal by my interpretation.    COORDINATION OF CARE:    6:08 PM- Pt advised of plan for treatment and pt agrees.  Labs Review Labs Reviewed - No data to display Imaging Review No results found.  EKG Interpretation   None      INCISION AND DRAINAGE Performed by: Jaynie Crumble A Consent: Verbal consent obtained. Risks and benefits: risks, benefits and alternatives were discussed Type: abscess  Body area: dorsal left foot  Anesthesia: local infiltration  Incision was  made with a scalpel.  Local anesthetic: lidocaine 2% w epinephrine  Anesthetic total: 4 ml  Complexity: complex Blunt dissection to break up loculations  Drainage: purulent  Drainage amount: large  Packing material: 1/4 in iodoform gauze  Patient tolerance: Patient tolerated the procedure well with no immediate complications.     MDM   1. Abscess of left foot     Patient of an abscess to the left foot, states originally had 2 but now 110 and the other one is getting larger. She underwent antibiotic. There is large area of fluctuance to the dorsal foot. The area was incised and drained with very a large amount of purulent drainage. Discussed good wound care at home. We'll continue antibiotics and Keflex. She will followup with primary care Dr. here in 2 days. He is otherwise nontoxic appearing.  Filed Vitals:   09/18/13 1721  BP: 142/64  Pulse: 104  Temp: 98.3 F (36.8 C)  TempSrc: Oral  Resp: 22  Height: 5\' 3"  (1.6 m)  Weight: 241 lb (109.317 kg)  SpO2: 100%    I personally performed the services described in this documentation, which was scribed in my presence. The recorded information has been reviewed and is accurate.    Lottie Mussel, PA-C 09/19/13 [phone]

## 2013-09-24 NOTE — ED Provider Notes (Addendum)
Medical screening examination/treatment/procedure(s) were performed by non-physician practitioner and as supervising physician I was immediately available for consultation/collaboration.  EKG Interpretation   None         Derwood Kaplan, MD 09/26/13 [phone]

## 2014-03-01 ENCOUNTER — Emergency Department (HOSPITAL_COMMUNITY): Payer: Self-pay

## 2014-03-01 ENCOUNTER — Emergency Department (HOSPITAL_COMMUNITY)
Admission: EM | Admit: 2014-03-01 | Discharge: 2014-03-01 | Disposition: A | Discharge status: Home / Self Care | Payer: Self-pay | Attending: Emergency Medicine | Admitting: Emergency Medicine

## 2014-03-01 ENCOUNTER — Encounter (HOSPITAL_COMMUNITY): Payer: Self-pay | Admitting: Emergency Medicine

## 2014-03-01 DIAGNOSIS — D72829 Elevated white blood cell count, unspecified: Secondary | ICD-10-CM | POA: Insufficient documentation

## 2014-03-01 DIAGNOSIS — R509 Fever, unspecified: Secondary | ICD-10-CM | POA: Insufficient documentation

## 2014-03-01 DIAGNOSIS — M79605 Pain in left leg: Secondary | ICD-10-CM

## 2014-03-01 DIAGNOSIS — M791 Myalgia, unspecified site: Secondary | ICD-10-CM

## 2014-03-01 DIAGNOSIS — IMO0001 Reserved for inherently not codable concepts without codable children: Secondary | ICD-10-CM | POA: Insufficient documentation

## 2014-03-01 DIAGNOSIS — R05 Cough: Secondary | ICD-10-CM | POA: Insufficient documentation

## 2014-03-01 DIAGNOSIS — I10 Essential (primary) hypertension: Secondary | ICD-10-CM | POA: Insufficient documentation

## 2014-03-01 DIAGNOSIS — Z872 Personal history of diseases of the skin and subcutaneous tissue: Secondary | ICD-10-CM | POA: Insufficient documentation

## 2014-03-01 DIAGNOSIS — F17200 Nicotine dependence, unspecified, uncomplicated: Secondary | ICD-10-CM | POA: Insufficient documentation

## 2014-03-01 DIAGNOSIS — M79609 Pain in unspecified limb: Secondary | ICD-10-CM | POA: Insufficient documentation

## 2014-03-01 DIAGNOSIS — D649 Anemia, unspecified: Secondary | ICD-10-CM | POA: Insufficient documentation

## 2014-03-01 DIAGNOSIS — M79604 Pain in right leg: Secondary | ICD-10-CM

## 2014-03-01 DIAGNOSIS — R059 Cough, unspecified: Secondary | ICD-10-CM | POA: Insufficient documentation

## 2014-03-01 DIAGNOSIS — Z792 Long term (current) use of antibiotics: Secondary | ICD-10-CM | POA: Insufficient documentation

## 2014-03-01 DIAGNOSIS — Z3202 Encounter for pregnancy test, result negative: Secondary | ICD-10-CM | POA: Insufficient documentation

## 2014-03-01 LAB — URINALYSIS, ROUTINE W REFLEX MICROSCOPIC
Bilirubin Urine: NEGATIVE
GLUCOSE, UA: NEGATIVE mg/dL
KETONES UR: NEGATIVE mg/dL
LEUKOCYTES UA: NEGATIVE
Nitrite: NEGATIVE
PH: 5 (ref 5.0–8.0)
Protein, ur: NEGATIVE mg/dL
Specific Gravity, Urine: 1.009 (ref 1.005–1.030)
Urobilinogen, UA: 0.2 mg/dL (ref 0.0–1.0)

## 2014-03-01 LAB — BASIC METABOLIC PANEL
BUN: 9 mg/dL (ref 6–23)
CHLORIDE: 101 meq/L (ref 96–112)
CO2: 19 meq/L (ref 19–32)
CREATININE: 0.79 mg/dL (ref 0.50–1.10)
Calcium: 8.9 mg/dL (ref 8.4–10.5)
GFR calc Af Amer: >90 mL/min (ref >90)
GFR calc non Af Amer: >90 mL/min (ref >90)
GLUCOSE: 119 mg/dL — AB (ref 70–99)
Potassium: 4.2 mEq/L (ref 3.7–5.3)
Sodium: 134 mEq/L — ABNORMAL LOW (ref 137–147)

## 2014-03-01 LAB — CBC WITH DIFFERENTIAL/PLATELET
BASOS PCT: 0 % (ref 0–1)
Basophils Absolute: 0 10*3/uL (ref 0.0–0.1)
EOS PCT: 0 % (ref 0–5)
Eosinophils Absolute: 0 10*3/uL (ref 0.0–0.7)
HEMATOCRIT: 28.1 % — AB (ref 36.0–46.0)
HEMOGLOBIN: 8.2 g/dL — AB (ref 12.0–15.0)
LYMPHS ABS: 0.6 10*3/uL — AB (ref 0.7–4.0)
Lymphocytes Relative: 3 % — ABNORMAL LOW (ref 12–46)
MCH: 20 pg — ABNORMAL LOW (ref 26.0–34.0)
MCHC: 29.2 g/dL — ABNORMAL LOW (ref 30.0–36.0)
MCV: 68.5 fL — AB (ref 78.0–100.0)
MONOS PCT: 7 % (ref 3–12)
Monocytes Absolute: 1.3 10*3/uL — ABNORMAL HIGH (ref 0.1–1.0)
NEUTROS ABS: 17.2 10*3/uL — AB (ref 1.7–7.7)
Neutrophils Relative %: 90 % — ABNORMAL HIGH (ref 43–77)
Platelets: 263 10*3/uL (ref 150–400)
RBC: 4.1 MIL/uL (ref 3.87–5.11)
RDW: 28.4 % — ABNORMAL HIGH (ref 11.5–15.5)
WBC: 19.1 10*3/uL — AB (ref 4.0–10.5)

## 2014-03-01 LAB — POC URINE PREG, ED: Preg Test, Ur: NEGATIVE

## 2014-03-01 LAB — URINE MICROSCOPIC-ADD ON

## 2014-03-01 LAB — POC OCCULT BLOOD, ED: FECAL OCCULT BLD: POSITIVE — AB

## 2014-03-01 MED ORDER — ONDANSETRON HCL 4 MG/2ML IJ SOLN
4.0000 mg | Freq: Once | INTRAMUSCULAR | Status: AC
  Administered 2014-03-01: 4 mg via INTRAVENOUS
  Filled 2014-03-01: qty 2

## 2014-03-01 MED ORDER — ACETAMINOPHEN 325 MG PO TABS
650.0000 mg | ORAL_TABLET | Freq: Once | ORAL | Status: AC
  Administered 2014-03-01: 650 mg via ORAL
  Filled 2014-03-01: qty 2

## 2014-03-01 MED ORDER — SODIUM CHLORIDE 0.9 % IV BOLUS (SEPSIS)
1000.0000 mL | Freq: Once | INTRAVENOUS | Status: AC
  Administered 2014-03-01: 1000 mL via INTRAVENOUS

## 2014-03-01 MED ORDER — AZITHROMYCIN 250 MG PO TABS: ORAL_TABLET | ORAL | Status: DC

## 2014-03-01 MED ORDER — FENTANYL CITRATE 0.05 MG/ML IJ SOLN
100.0000 ug | Freq: Once | INTRAMUSCULAR | Status: AC
  Administered 2014-03-01: 100 ug via INTRAVENOUS
  Filled 2014-03-01: qty 2

## 2014-03-01 MED ORDER — HYDROCODONE-ACETAMINOPHEN 5-325 MG PO TABS: 1.0000 | ORAL_TABLET | ORAL | Status: DC | PRN

## 2014-03-01 NOTE — ED Provider Notes (Signed)
CSN: [id number]     Arrival date & time 03/01/14  0604 History   First MD Initiated Contact with Patient 03/01/14 [phone]     Chief Complaint  Patient presents with  . Leg Pain  . Chills     (Consider location/radiation/quality/duration/timing/severity/associated sxs/prior Treatment) HPI Jordan Pruitt Is a 38 year old female who presents the emergency department with multiple complaints.  Chiefly the patient complains of fever, body aches, chills and cough.  Patient states that one week ago she developed a persistent cough.  It is described as productive, no chest pain.  Yesterday she developed fever, chills, myalgias, decreased appetite.  She denies abdominal pain, nausea, vomiting,, diarrhea or urinary symptoms. She has no known contacts with similar symptoms.  The patient also complains of bilateral leg pain.  She states that this is severe.  She has pain that radiates down the back of the leg and into the calf on the right side and pain which wraps around from the hip to the groin and down the medial leg on the left side.  She states is not new.  She had a previous MRI many years ago but didn't denies knowing the results.  She states that this kind of pain comes and goes however it is severe and worse since last night.  She denies weakness, loss of bowel or bladder control, rashes, headache, photophobia, IV drug use, recent  procedures her back.  Past Medical History  Diagnosis Date  . Paronychia    Past Surgical History  Procedure Laterality Date  . Tubal ligation    . Cesarean section      x4   No family history on file. History  Substance Use Topics  . Smoking status: Current Every Day Smoker -- 0.50 packs/day    Types: Cigarettes  . Smokeless tobacco: Not on file  . Alcohol Use: Yes   OB History   Grav Para Term Preterm Abortions TAB SAB Ect Mult Living                 Review of Systems Ten systems reviewed and are negative for acute change, except as noted in the HPI.      Allergies  Ibuprofen  Home Medications   Prior to Admission medications   Medication Sig Start Date End Date Taking? Authorizing Provider  cephALEXin (KEFLEX) 500 MG capsule Take 1 capsule (500 mg total) by mouth 4 (four) times daily. 09/18/13   Tatyana A Kirichenko, PA-C  HYDROcodone-acetaminophen (NORCO) 5-325 MG per tablet Take 1 tablet by mouth every 6 (six) hours as needed for moderate pain. 09/18/13   Tatyana A Kirichenko, PA-C  HYDROcodone-acetaminophen (NORCO/VICODIN) 5-325 MG per tablet Take 1 tablet by mouth every 4 (four) hours as needed for pain. 09/13/13   Larene Pickett, PA-C  Prenatal Vit-Fe Fumarate-FA (PRENATAL MULTIVITAMIN) TABS tablet Take 1 tablet by mouth daily at 12 noon.    Historical Provider, MD  sulfamethoxazole-trimethoprim (SEPTRA DS) 800-160 MG per tablet Take 1 tablet by mouth every 12 (twelve) hours. 09/13/13   Larene Pickett, PA-C  sulfamethoxazole-trimethoprim (SEPTRA DS) 800-160 MG per tablet Take 1 tablet by mouth every 12 (twelve) hours. 09/18/13   Tatyana A Kirichenko, PA-C  vitamin B-12 (CYANOCOBALAMIN) 1000 MCG tablet Take 1,000 mcg by mouth daily.    Historical Provider, MD   BP 139/65  Pulse 120  Temp(Src) 101.1 F (38.4 C) (Oral)  Resp 20  Ht 5\' 3"  (1.6 m)  Wt 235 lb (106.595 kg)  BMI 41.64  kg/m2  SpO2 99%  LMP 02/28/2014 Physical Exam .Physical Exam  Nursing note and vitals reviewed. Constitutional: She is oriented to person, place, and time. She appears well-developed and well-nourished. No distress.  HENT:  Head: Normocephalic and atraumatic.  Eyes: Conjunctivae normal and EOM are normal. Pupils are equal, round, and reactive to light. No scleral icterus.  Neck: Normal range of motion.  Cardiovascular: Tachycardic, regular rhythm and normal heart sounds.  Exam reveals no gallop and no friction rub.   No murmur heard. Pulmonary/Chest: Effort normal and breath sounds normal. No respiratory distress.  Productive cough  Abdominal:  Soft. Bowel sounds are normal. She exhibits no distension and no mass. There is no tenderness. There is no guarding.  Neurological: She is alert and oriented to person, place, and time. Normal DTRs, sensation intact, positive straight leg test on the right.  Normal strength bilaterally.  Skin: Skin is warm and dry. She is not diaphoretic.    ED Course  Procedures (including critical care time) Labs Review Labs Reviewed - No data to display  Imaging Review No results found. DG Chest 2 View (Final result)  Result time: 03/01/14 07:04:14    Final result by Rad Results In Interface (03/01/14 07:04:14)    Narrative:   CLINICAL DATA: Shortness of breath  EXAM: CHEST 2 VIEW  COMPARISON: None.  FINDINGS: The heart size and mediastinal contours are within normal limits. Both lungs are clear. The visualized skeletal structures are unremarkable.  IMPRESSION: No active cardiopulmonary disease.   Electronically Signed By: Conchita Paris M.D. On: 03/01/2014 07:04          EKG Interpretation None      MDM   Final diagnoses:  None    Filed Vitals:   03/01/14 0612  BP: 139/65  Pulse: 120  Temp: 101.1 F (38.4 C)  TempSrc: Oral  Resp: 20  Height: 5\' 3"  (1.6 m)  Weight: 235 lb (106.595 kg)  SpO2: 99%    Patient with hypertension, tachycardia, febrile.  Lab work, chest x-ray, urinalysis pending. Patient's leg pain appears consistent with L3-L4, L5-S1 radiculopathy.  Feel that these are  7:58 AM BP 102/53  Pulse 104  Temp(Src) 99.9 F (37.7 C) (Oral)  Resp 20  Ht 5\' 3"  (1.6 m)  Wt 235 lb (106.595 kg)  BMI 41.64 kg/m2  SpO2 100%  LMP 02/28/2014 Patient with POS FOBT, however it is weakly positive. Patient has menses currently and was wearing a Pad which was tucked against her anus.  SHe states that she has been told previously that she has anemia and reprots  Long term ice pica, but denies Starch pica, racing heart, weakness, syncope.   Filed Vitals:    03/01/14 0612 03/01/14 0722 03/01/14 1032  BP: 139/65 102/53 111/57  Pulse: 120 104 93  Temp: 101.1 F (38.4 C) 99.9 F (37.7 C) 99.3 F (37.4 C)  TempSrc: Oral Oral Oral  Resp: 20    Height: 5\' 3"  (1.6 m)    Weight: 235 lb (106.595 kg)    SpO2: 99% 100% 100%   Patient fever resolved, States that she is feeling much better, myalgias and other sxs resolved I feel the patient has a viral infection Encouraged supportive care at home Return precautions discussed.    Margarita Mail, PA-C 03/05/14 [phone]

## 2014-03-01 NOTE — Progress Notes (Signed)
CM visited patient regarding ED & urgent care visits and no documented PCP or insurance. Discussed the importance of a PCP and patient stated that she was interested in information to obtain a PCP. Provided a resource list to patient and answered questions regarding resources listed. Edwyna Shell, RN, BSN, Case Managers 03/01/2014 9:45 AM

## 2014-03-01 NOTE — ED Notes (Signed)
Pt. reports bilateral leg pain with chills and low grade fever onset yesterday . Ambulatory / denies injury.

## 2014-03-01 NOTE — Discharge Instructions (Signed)
Do not take the norco unless you have sever pain. Do not drive, operate heavy machinery, drink alcohol, or take other tylenol containing products with this medicine.  Fever, Adult A fever is a higher than normal body temperature. In an adult, an oral temperature around 98.6 F (37 C) is considered normal. A temperature of 100.4 F (38 C) or higher is generally considered a fever. Mild or moderate fevers generally have no long-term effects and often do not require treatment. Extreme fever (greater than or equal to 106 F or 41.1 C) can cause seizures. The sweating that may occur with repeated or prolonged fever may cause dehydration. Elderly people can develop confusion during a fever. A measured temperature can vary with:  Age.  Time of day.  Method of measurement (mouth, underarm, rectal, or ear). The fever is confirmed by taking a temperature with a thermometer. Temperatures can be taken different ways. Some methods are accurate and some are not.  An oral temperature is used most commonly. Electronic thermometers are fast and accurate.  An ear temperature will only be accurate if the thermometer is positioned as recommended by the manufacturer.  A rectal temperature is accurate and done for those adults who have a condition where an oral temperature cannot be taken.  An underarm (axillary) temperature is not accurate and not recommended. Fever is a symptom, not a disease.  CAUSES   Infections commonly cause fever.  Some noninfectious causes for fever include:  Some arthritis conditions.  Some thyroid or adrenal gland conditions.  Some immune system conditions.  Some types of cancer.  A medicine reaction.  High doses of certain street drugs such as methamphetamine.  Dehydration.  Exposure to high outside or room temperatures.  Occasionally, the source of a fever cannot be determined. This is sometimes called a "fever of unknown origin" (FUO).  Some situations may  lead to a temporary rise in body temperature that may go away on its own. Examples are:  Childbirth.  Surgery.  Intense exercise. HOME CARE INSTRUCTIONS   Take appropriate medicines for fever. Follow dosing instructions carefully. If you use acetaminophen to reduce the fever, be careful to avoid taking other medicines that also contain acetaminophen. Do not take aspirin for a fever if you are younger than age 51. There is an association with Reye's syndrome. Reye's syndrome is a rare but potentially deadly disease.  If an infection is present and antibiotics have been prescribed, take them as directed. Finish them even if you start to feel better.  Rest as needed.  Maintain an adequate fluid intake. To prevent dehydration during an illness with prolonged or recurrent fever, you may need to drink extra fluid.Drink enough fluids to keep your urine clear or pale yellow.  Sponging or bathing with room temperature water may help reduce body temperature. Do not use ice water or alcohol sponge baths.  Dress comfortably, but do not over-bundle. SEEK MEDICAL CARE IF:   You are unable to keep fluids down.  You develop vomiting or diarrhea.  You are not feeling at least partly better after 3 days.  You develop new symptoms or problems. SEEK IMMEDIATE MEDICAL CARE IF:   You have shortness of breath or trouble breathing.  You develop excessive weakness.  You are dizzy or you faint.  You are extremely thirsty or you are making little or no urine.  You develop new pain that was not there before (such as in the head, neck, chest, back, or abdomen).  You have  persistant vomiting and diarrhea for more than 1 to 2 days.  You develop a stiff neck or your eyes become sensitive to light.  You develop a skin rash.  You have a fever or persistent symptoms for more than 2 to 3 days.  You have a fever and your symptoms suddenly get worse. MAKE SURE YOU:   Understand these  instructions.  Will watch your condition.  Will get help right away if you are not doing well or get worse. Document Released: 04/27/2001 Document Revised: 01/24/2012 Document Reviewed: 09/02/2011 [facility] Patient Information 2014 [place], [place].  Neuropathic Pain We often think that pain has a physical cause. If we get rid of the cause, the pain should go away. Nerves themselves can also cause pain. It is called neuropathic pain, which means nerve abnormality. It may be difficult for the patients who have it and for the treating caregivers. Pain is usually described as acute (short-lived) or chronic (long-lasting). Acute pain is related to the physical sensations caused by an injury. It can last from a few seconds to many weeks, but it usually goes away when normal healing occurs. Chronic pain lasts beyond the typical healing time. With neuropathic pain, the nerve fibers themselves may be damaged or injured. They then send incorrect signals to other pain centers. The pain you feel is real, but the cause is not easy to find.  CAUSES  Chronic pain can result from diseases, such as diabetes and shingles (an infection related to chickenpox), or from trauma, surgery, or amputation. It can also happen without any known injury or disease. The nerves are sending pain messages, even though there is no identifiable cause for such messages.   Other common causes of neuropathy include diabetes, phantom limb pain, or Regional Pain Syndrome (RPS).  As with all forms of chronic back pain, if neuropathy is not correctly treated, there can be a number of associated problems that lead to a downward cycle for the patient. These include depression, sleeplessness, feelings of fear and anxiety, limited social interaction and inability to do normal daily activities or work.  The most dramatic and mysterious example of neuropathic pain is called "phantom limb syndrome." This occurs when an arm or a leg has been removed  because of illness or injury. The brain still gets pain messages from the nerves that originally carried impulses from the missing limb. These nerves now seem to misfire and cause troubling pain.  Neuropathic pain often seems to have no cause. It responds poorly to standard pain treatment. Neuropathic pain can occur after:  Shingles (herpes zoster virus infection).  A lasting burning sensation of the skin, caused usually by injury to a peripheral nerve.  Peripheral neuropathy which is widespread nerve damage, often caused by diabetes or alcoholism.  Phantom limb pain following an amputation.  Facial nerve problems (trigeminal neuralgia).  Multiple sclerosis.  Reflex sympathetic dystrophy.  Pain which comes with cancer and cancer chemotherapy.  Entrapment neuropathy such as when pressure is put on a nerve such as in carpal tunnel syndrome.  Back, leg, and hip problems (sciatica).  Spine or back surgery.  HIV Infection or AIDS where nerves are infected by viruses. Your caregiver can explain items in the above list which may apply to you. SYMPTOMS  Characteristics of neuropathic pain are:  Severe, sharp, electric shock-like, shooting, lightening-like, knife-like.  Pins and needles sensation.  Deep burning, deep cold, or deep ache.  Persistent numbness, tingling, or weakness.  Pain resulting from light touch or  other stimulus that would not usually cause pain.  Increased sensitivity to something that would normally cause pain, such as a pinprick. Pain may persist for months or years following the healing of damaged tissues. When this happens, pain signals no longer sound an alarm about current injuries or injuries about to happen. Instead, the alarm system itself is not working correctly.  Neuropathic pain may get worse instead of better over time. For some people, it can lead to serious disability. It is important to be aware that severe injury in a limb can occur without a  proper, protective pain response.Burns, cuts, and other injuries may go unnoticed. Without proper treatment, these injuries can become infected or lead to further disability. Take any injury seriously, and consult your caregiver for treatment. DIAGNOSIS  When you have a pain with no known cause, your caregiver will probably ask some specific questions:   Do you have any other conditions, such as diabetes, shingles, multiple sclerosis, or HIV infection?  How would you describe your pain? (Neuropathic pain is often described as shooting, stabbing, burning, or searing.)  Is your pain worse at any time of the day? (Neuropathic pain is usually worse at night.)  Does the pain seem to follow a certain physical pathway?  Does the pain come from an area that has missing or injured nerves? (An example would be phantom limb pain.)  Is the pain triggered by minor things such as rubbing against the sheets at night? These questions often help define the type of pain involved. Once your caregiver knows what is happening, treatment can begin. Anticonvulsant, antidepressant drugs, and various pain relievers seem to work in some cases. If another condition, such as diabetes is involved, better management of that disorder may relieve the neuropathic pain.  TREATMENT  Neuropathic pain is frequently long-lasting and tends not to respond to treatment with narcotic type pain medication. It may respond well to other drugs such as antiseizure and antidepressant medications. Usually, neuropathic problems do not completely go away, but partial improvement is often possible with proper treatment. Your caregivers have large numbers of medications available to treat you. Do not be discouraged if you do not get immediate relief. Sometimes different medications or a combination of medications will be tried before you receive the results you are hoping for. See your caregiver if you have pain that seems to be coming from nowhere  and does not go away. Help is available.  SEEK IMMEDIATE MEDICAL CARE IF:   There is a sudden change in the quality of your pain, especially if the change is on only one side of the body.  You notice changes of the skin, such as redness, black or purple discoloration, swelling, or an ulcer.  You cannot move the affected limbs. Document Released: 07/29/2004 Document Revised: 01/24/2012 Document Reviewed: 07/29/2004 [facility] Patient Information 2014 [place].  Anemia, Nonspecific Anemia is a condition in which the concentration of red blood cells or hemoglobin in the blood is below normal. Hemoglobin is a substance in red blood cells that carries oxygen to the tissues of the body. Anemia results in not enough oxygen reaching these tissues.  CAUSES  Common causes of anemia include:   Excessive bleeding. Bleeding may be internal or external. This includes excessive bleeding from periods (in women) or from the intestine.   Poor nutrition.   Chronic kidney, thyroid, and liver disease.  Bone marrow disorders that decrease red blood cell production.  Cancer and treatments for cancer.  HIV, AIDS,  and their treatments.  Spleen problems that increase red blood cell destruction.  Blood disorders.  Excess destruction of red blood cells due to infection, medicines, and autoimmune disorders. SIGNS AND SYMPTOMS   Minor weakness.   Dizziness.   Headache.  Palpitations.   Shortness of breath, especially with exercise.   Paleness.  Cold sensitivity.  Indigestion.  Nausea.  Difficulty sleeping.  Difficulty concentrating. Symptoms may occur suddenly or they may develop slowly.  DIAGNOSIS  Additional blood tests are often needed. These help your health care provider determine the best treatment. Your health care provider will check your stool for blood and look for other causes of blood loss.  TREATMENT  Treatment varies depending on the cause of the anemia.  Treatment can include:   Supplements of iron, vitamin S28, or folic acid.   Hormone medicines.   A blood transfusion. This may be needed if blood loss is severe.   Hospitalization. This may be needed if there is significant continual blood loss.   Dietary changes.  Spleen removal. HOME CARE INSTRUCTIONS Keep all follow-up appointments. It often takes many weeks to correct anemia, and having your health care provider check on your condition and your response to treatment is very important. SEEK IMMEDIATE MEDICAL CARE IF:   You develop extreme weakness, shortness of breath, or chest pain.   You become dizzy or have trouble concentrating.  You develop heavy vaginal bleeding.   You develop a rash.   You have bloody or black, tarry stools.   You faint.   You vomit up blood.   You vomit repeatedly.   You have abdominal pain.  You have a fever or persistent symptoms for more than 2 3 days.   You have a fever and your symptoms suddenly get worse.   You are dehydrated.  MAKE SURE YOU:  Understand these instructions.  Will watch your condition.  Will get help right away if you are not doing well or get worse. Document Released: 12/09/2004 Document Revised: 07/04/2013 Document Reviewed: 04/27/2013 [facility] Patient Information 2014 [place]. Acetaminophen; Hydrocodone tablets or capsules What is this medicine? ACETAMINOPHEN; HYDROCODONE (a set a MEE noe fen; hye droe KOE done) is a pain reliever. It is used to treat mild to moderate pain. This medicine may be used for other purposes; ask your health care provider or pharmacist if you have questions. COMMON BRAND NAME(S): Anexsia, Bancap HC , Ceta-Plus, Co-Gesic, Comfortpak , Dolagesic, [employer], DuoCet , Hydrocet , Hydrogesic, Lorcet HD, Lorcet Plus, Lorcet, [place], Margesic H, Maxidone, [place], Polygesic, [place], [place], Vicodin ES, Vicodin HP, Vicodin, Charlane Ferretti What should I tell my health  care provider before I take this medicine? They need to know if you have any of these conditions: -brain tumor -Crohn's disease, inflammatory bowel disease, or ulcerative colitis -drug abuse or addiction -head injury -heart or circulation problems -if you often drink alcohol -kidney disease or problems going to the bathroom -liver disease -lung disease, asthma, or breathing problems -an unusual or allergic reaction to acetaminophen, hydrocodone, other opioid analgesics, other medicines, foods, dyes, or preservatives -pregnant or trying to get pregnant -breast-feeding How should I use this medicine? Take this medicine by mouth. Swallow it with a full glass of water. Follow the directions on the prescription label. If the medicine upsets your stomach, take the medicine with food or milk. Do not take more than you are told to take. Talk to your pediatrician regarding the use of this medicine in children. This medicine is  not approved for use in children. Overdosage: If you think you have taken too much of this medicine contact a poison control center or emergency room at once. NOTE: This medicine is only for you. Do not share this medicine with others. What if I miss a dose? If you miss a dose, take it as soon as you can. If it is almost time for your next dose, take only that dose. Do not take double or extra doses. What may interact with this medicine? -alcohol -antihistamines -isoniazid -medicines for depression, anxiety, or psychotic disturbances -medicines for sleep -muscle relaxants -naltrexone -narcotic medicines (opiates) for pain -phenobarbital -ritonavir -tramadol This list may not describe all possible interactions. Give your health care provider a list of all the medicines, herbs, non-prescription drugs, or dietary supplements you use. Also tell them if you smoke, drink alcohol, or use illegal drugs. Some items may interact with your medicine. What should I watch for while  using this medicine? Tell your doctor or health care professional if your pain does not go away, if it gets worse, or if you have new or a different type of pain. You may develop tolerance to the medicine. Tolerance means that you will need a higher dose of the medicine for pain relief. Tolerance is normal and is expected if you take the medicine for a long time. Do not suddenly stop taking your medicine because you may develop a severe reaction. Your body becomes used to the medicine. This does NOT mean you are addicted. Addiction is a behavior related to getting and using a drug for a non-medical reason. If you have pain, you have a medical reason to take pain medicine. Your doctor will tell you how much medicine to take. If your doctor wants you to stop the medicine, the dose will be slowly lowered over time to avoid any side effects. You may get drowsy or dizzy when you first start taking the medicine or change doses. Do not drive, use machinery, or do anything that may be dangerous until you know how the medicine affects you. Stand or sit up slowly. There are different types of narcotic medicines (opiates) for pain. If you take more than one type at the same time, you may have more side effects. Give your health care provider a list of all medicines you use. Your doctor will tell you how much medicine to take. Do not take more medicine than directed. Call emergency for help if you have problems breathing. The medicine will cause constipation. Try to have a bowel movement at least every 2 to 3 days. If you do not have a bowel movement for 3 days, call your doctor or health care professional. Too much acetaminophen can be very dangerous. Do not take Tylenol (acetaminophen) or medicines that contain acetaminophen with this medicine. Many non-prescription medicines contain acetaminophen. Always read the labels carefully. What side effects may I notice from receiving this medicine? Side effects that you  should report to your doctor or health care professional as soon as possible: -allergic reactions like skin rash, itching or hives, swelling of the face, lips, or tongue -breathing problems -confusion -feeling faint or lightheaded, falls -stomach pain -yellowing of the eyes or skin Side effects that usually do not require medical attention (report to your doctor or health care professional if they continue or are bothersome): -nausea, vomiting -stomach upset This list may not describe all possible side effects. Call your doctor for medical advice about side effects. You may report side  effects to FDA at 1-800-FDA-1088. Where should I keep my medicine? Keep out of the reach of children. This medicine can be abused. Keep your medicine in a safe place to protect it from theft. Do not share this medicine with anyone. Selling or giving away this medicine is dangerous and against the law. Store at room temperature between 15 and 30 degrees C (59 and 86 degrees F). Protect from light. Keep container tightly closed.  Throw away any unused medicine after the expiration date. Discard unused medicine and used packaging carefully. Pets and children can be harmed if they find used or lost packages. NOTE: This sheet is a summary. It may not cover all possible information. If you have questions about this medicine, talk to your doctor, pharmacist, or health care provider.  2014, [employer]. (2013-06-25 13:15:56)

## 2014-03-01 NOTE — ED Provider Notes (Signed)
Patient seen/examined in the Emergency Department in conjunction with Midlevel Provider [place] Patient reports fever, chills Exam : awake/alert, no distress, abd soft, no focal weakness noted in lower extremities, no signs of cellulitis to lower extremities Plan: labs pending at this time.     Sharyon Cable, MD 03/01/14 [phone]

## 2014-03-09 NOTE — ED Provider Notes (Signed)
Medical screening examination/treatment/procedure(s) were performed by non-physician practitioner and as supervising physician I was immediately available for consultation/collaboration.   EKG Interpretation None        Elyn Peers, MD 03/09/14 [phone]

## 2014-04-05 ENCOUNTER — Emergency Department (HOSPITAL_COMMUNITY)
Admission: EM | Admit: 2014-04-05 | Discharge: 2014-04-05 | Disposition: A | Discharge status: Home / Self Care | Payer: Self-pay | Attending: Emergency Medicine | Admitting: Emergency Medicine

## 2014-04-05 ENCOUNTER — Encounter (HOSPITAL_COMMUNITY): Payer: Self-pay | Admitting: Emergency Medicine

## 2014-04-05 DIAGNOSIS — Z79899 Other long term (current) drug therapy: Secondary | ICD-10-CM | POA: Insufficient documentation

## 2014-04-05 DIAGNOSIS — Z792 Long term (current) use of antibiotics: Secondary | ICD-10-CM | POA: Insufficient documentation

## 2014-04-05 DIAGNOSIS — L03119 Cellulitis of unspecified part of limb: Principal | ICD-10-CM

## 2014-04-05 DIAGNOSIS — L03115 Cellulitis of right lower limb: Secondary | ICD-10-CM

## 2014-04-05 DIAGNOSIS — M79609 Pain in unspecified limb: Secondary | ICD-10-CM

## 2014-04-05 DIAGNOSIS — F17200 Nicotine dependence, unspecified, uncomplicated: Secondary | ICD-10-CM | POA: Insufficient documentation

## 2014-04-05 DIAGNOSIS — L02419 Cutaneous abscess of limb, unspecified: Secondary | ICD-10-CM | POA: Insufficient documentation

## 2014-04-05 DIAGNOSIS — M7989 Other specified soft tissue disorders: Secondary | ICD-10-CM

## 2014-04-05 MED ORDER — HYDROCODONE-ACETAMINOPHEN 5-325 MG PO TABS: 1.0000 | ORAL_TABLET | Freq: Four times a day (QID) | ORAL | Status: DC | PRN

## 2014-04-05 MED ORDER — OXYCODONE-ACETAMINOPHEN 5-325 MG PO TABS
1.0000 | ORAL_TABLET | Freq: Once | ORAL | Status: AC
  Administered 2014-04-05: 1 via ORAL
  Filled 2014-04-05: qty 1

## 2014-04-05 MED ORDER — CLINDAMYCIN HCL 150 MG PO CAPS: 150.0000 mg | ORAL_CAPSULE | Freq: Four times a day (QID) | ORAL | Status: DC

## 2014-04-05 NOTE — ED Notes (Signed)
Rt. Leg pain from the thigh to the ankle. Pain is constant and sharp.  Pt. Reports leg turns red and is warm to touch.  This  Happened in the past, has been treated with antibiotics and pain meds.   Pt. Denies any injuries.  The pain began yesterday

## 2014-04-05 NOTE — ED Provider Notes (Signed)
CSN: [id number]     Arrival date & time 04/05/14  1047 History  This chart was scribed for non-physician practitioner, Domenic Moras, PA-C working with Blanchie Dessert, MD by Frederich Balding, ED scribe. This patient was seen in room TR09C/TR09C and the patient's care was started at 11:22 AM.   Chief Complaint  Patient presents with  . Leg Pain   The history is provided by the patient. No language interpreter was used.   HPI Comments: TIAUNA Pruitt is a 38 y.o. female who presents to the Emergency Department complaining of gradual onset, sharp right leg pain with associated swelling that started yesterday. States the pain is from her thigh to her ankle. Denies any injury. She states that her leg is sometimes red and warm to touch. States she has had a fever but taken Advil with relief. Pt has had similar symptoms in the past and was hospitalized and given IV antibiotics. Denies chest pain, trouble breathing, hemoptysis. Denies history of DVT or cancer. Denies recent surgery or long distance travel. Pt is not currently taking any hormone pills.   Past Medical History  Diagnosis Date  . Paronychia    Past Surgical History  Procedure Laterality Date  . Tubal ligation    . Cesarean section      x4   No family history on file. History  Substance Use Topics  . Smoking status: Current Every Day Smoker -- 0.50 packs/day    Types: Cigarettes  . Smokeless tobacco: Not on file  . Alcohol Use: Yes   OB History   Grav Para Term Preterm Abortions TAB SAB Ect Mult Living                 Review of Systems  Constitutional: Negative for fever.  Respiratory: Negative for cough.   Cardiovascular: Positive for leg swelling. Negative for chest pain.  Musculoskeletal: Positive for myalgias.  All other systems reviewed and are negative.  Allergies  Ibuprofen  Home Medications   Prior to Admission medications   Medication Sig Start Date End Date Taking? Authorizing Provider  azithromycin  (ZITHROMAX Z-PAK) 250 MG tablet 2 po day one, then 1 daily x 4 days 03/01/14   Margarita Mail, PA-C  ferrous sulfate 325 (65 FE) MG tablet Take 325 mg by mouth daily with breakfast.    Historical Provider, MD  Garcinia Cambogia-Chromium 500-200 MG-MCG TABS Take 2 capsules by mouth daily.    Historical Provider, MD  HYDROcodone-acetaminophen (NORCO) 5-325 MG per tablet Take 1-2 tablets by mouth every 4 (four) hours as needed. 03/01/14   Margarita Mail, PA-C  Multiple Vitamins-Minerals (MULTIVITAMIN WITH MINERALS) tablet Take 1 tablet by mouth daily.    Historical Provider, MD   BP 121/68  Pulse 88  Temp(Src) 98.3 F (36.8 C) (Oral)  Resp 18  Ht 5\' 3"  (1.6 m)  Wt 237 lb 2 oz (107.559 kg)  BMI 42.02 kg/m2  SpO2 100%  LMP 03/12/2014  Physical Exam  Nursing note and vitals reviewed. Constitutional: She is oriented to person, place, and time. She appears well-developed and well-nourished. No distress.  HENT:  Head: Normocephalic and atraumatic.  Eyes: EOM are normal.  Neck: Neck supple. No tracheal deviation present.  Cardiovascular: Normal rate, regular rhythm and normal heart sounds.   Pedal pulses intact.   Pulmonary/Chest: Effort normal and breath sounds normal. No respiratory distress. She has no wheezes. She has no rhonchi. She has no rales.  Musculoskeletal: Normal range of motion.  Right lower extremity with  large circumferential area of erythema, warmth and tenderness involving 70% of the lower leg. No area of induration or fluctuance. No obvious abscess.  Neurological: She is alert and oriented to person, place, and time.  Skin: Skin is warm and dry.  Psychiatric: She has a normal mood and affect. Her behavior is normal.    ED Course  Procedures (including critical care time)  DIAGNOSTIC STUDIES: Oxygen Saturation is 100% on RA, normal by my interpretation.    COORDINATION OF CARE: 11:25 AM-Discussed treatment plan which includes doppler study with pt at bedside and pt  agreed to plan. Advised pt that if the study is negative, she will be given antibiotics.   1:59 PM DVT study is negative.  Will treat for cellulitis with clindamycin and abx.  Pt may return if no improvement with abx.  No systemic involvement.    Labs Review Labs Reviewed - No data to display  Imaging Review No results found.   EKG Interpretation None      MDM   Final diagnoses:  Cellulitis of right leg without foot    BP 126/65  Pulse 74  Temp(Src) 98.3 F (36.8 C) (Oral)  Resp 18  Ht 5\' 3"  (1.6 m)  Wt 237 lb 2 oz (107.559 kg)  BMI 42.02 kg/m2  SpO2 100%  LMP 03/12/2014   I personally performed the services described in this documentation, which was scribed in my presence. The recorded information has been reviewed and is accurate.  Domenic Moras, PA-C 04/05/14 1400

## 2014-04-05 NOTE — ED Provider Notes (Signed)
Medical screening examination/treatment/procedure(s) were performed by non-physician practitioner and as supervising physician I was immediately available for consultation/collaboration.   EKG Interpretation None        Jannel Lynne, MD 04/05/14 1507 

## 2014-04-05 NOTE — Discharge Instructions (Signed)
Cellulitis Cellulitis is an infection of the skin and the tissue beneath it. The infected area is usually red and tender. Cellulitis occurs most often in the arms and lower legs.  CAUSES  Cellulitis is caused by bacteria that enter the skin through cracks or cuts in the skin. The most common types of bacteria that cause cellulitis are Staphylococcus and Streptococcus. SYMPTOMS   Redness and warmth.  Swelling.  Tenderness or pain.  Fever. DIAGNOSIS  Your caregiver can usually determine what is wrong based on a physical exam. Blood tests may also be done. TREATMENT  Treatment usually involves taking an antibiotic medicine. HOME CARE INSTRUCTIONS   Take your antibiotics as directed. Finish them even if you start to feel better.  Keep the infected arm or leg elevated to reduce swelling.  Apply a warm cloth to the affected area up to 4 times per day to relieve pain.  Only take over-the-counter or prescription medicines for pain, discomfort, or fever as directed by your caregiver.  Keep all follow-up appointments as directed by your caregiver. SEEK MEDICAL CARE IF:   You notice red streaks coming from the infected area.  Your red area gets larger or turns dark in color.  Your bone or joint underneath the infected area becomes painful after the skin has healed.  Your infection returns in the same area or another area.  You notice a swollen bump in the infected area.  You develop new symptoms. SEEK IMMEDIATE MEDICAL CARE IF:   You have a fever.  You feel very sleepy.  You develop vomiting or diarrhea.  You have a general ill feeling (malaise) with muscle aches and pains. MAKE SURE YOU:   Understand these instructions.  Will watch your condition.  Will get help right away if you are not doing well or get worse. Document Released: 08/11/2005 Document Revised: 05/02/2012 Document Reviewed: 01/17/2012 [employer] Patient Information 2014 [employer].  

## 2014-04-05 NOTE — Progress Notes (Signed)
VASCULAR LAB PRELIMINARY  PRELIMINARY  PRELIMINARY  PRELIMINARY  Bilateral lower extremity venous duplex completed.    Preliminary report:  *Bilateral:  No evidence of DVT, superficial thrombosis, or Baker's Cyst.   Jordan Pruitt, RVS 04/05/2014, 2:13 PM

## 2014-06-02 ENCOUNTER — Emergency Department (HOSPITAL_COMMUNITY)
Admission: EM | Admit: 2014-06-02 | Discharge: 2014-06-02 | Disposition: A | Discharge status: Home / Self Care | Payer: Self-pay | Attending: Emergency Medicine | Admitting: Emergency Medicine

## 2014-06-02 DIAGNOSIS — T148XXA Other injury of unspecified body region, initial encounter: Secondary | ICD-10-CM

## 2014-06-02 DIAGNOSIS — F17200 Nicotine dependence, unspecified, uncomplicated: Secondary | ICD-10-CM | POA: Insufficient documentation

## 2014-06-02 DIAGNOSIS — Y9289 Other specified places as the place of occurrence of the external cause: Secondary | ICD-10-CM | POA: Insufficient documentation

## 2014-06-02 DIAGNOSIS — Z79899 Other long term (current) drug therapy: Secondary | ICD-10-CM | POA: Insufficient documentation

## 2014-06-02 DIAGNOSIS — Y9389 Activity, other specified: Secondary | ICD-10-CM | POA: Insufficient documentation

## 2014-06-02 DIAGNOSIS — Z872 Personal history of diseases of the skin and subcutaneous tissue: Secondary | ICD-10-CM | POA: Insufficient documentation

## 2014-06-02 DIAGNOSIS — IMO0002 Reserved for concepts with insufficient information to code with codable children: Secondary | ICD-10-CM | POA: Insufficient documentation

## 2014-06-02 MED ORDER — TRAMADOL HCL 50 MG PO TABS
50.0000 mg | ORAL_TABLET | Freq: Once | ORAL | Status: AC
  Administered 2014-06-02: 50 mg via ORAL
  Filled 2014-06-02: qty 1

## 2014-06-02 MED ORDER — TRAMADOL HCL 50 MG PO TABS: 50.0000 mg | ORAL_TABLET | Freq: Four times a day (QID) | ORAL | Status: DC | PRN

## 2014-06-02 MED ORDER — METHOCARBAMOL 500 MG PO TABS: 500.0000 mg | ORAL_TABLET | Freq: Two times a day (BID) | ORAL | Status: DC | PRN
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
## 2014-06-02 NOTE — Discharge Instructions (Signed)
Please call your doctor for a followup appointment within 24-48 hours. When you talk to your doctor please let them know that you were seen in the emergency department and have them acquire all of your records so that they can discuss the findings with you and formulate a treatment plan to fully care for your new and ongoing problems. ° ° °Emergency Department Resource Guide °1) Find a Doctor and Pay Out of Pocket °Although you won't have to find out who is covered by your insurance plan, it is a good idea to ask around and get recommendations. You will then need to call the office and see if the doctor you have chosen will accept you as a new patient and what types of options they offer for patients who are self-pay. Some doctors offer discounts or will set up payment plans for their patients who do not have insurance, but you will need to ask so you aren't surprised when you get to your appointment. ° °2) Contact Your Local Health Department °Not all health departments have doctors that can see patients for sick visits, but many do, so it is worth a call to see if yours does. If you don't know where your local health department is, you can check in your phone book. The CDC also has a tool to help you locate your state's health department, and many state websites also have listings of all of their local health departments. ° °3) Find a Walk-in Clinic °If your illness is not likely to be very severe or complicated, you may want to try a walk in clinic. These are popping up all over the country in pharmacies, drugstores, and shopping centers. They're usually staffed by nurse practitioners or physician assistants that have been trained to treat common illnesses and complaints. They're usually fairly quick and inexpensive. However, if you have serious medical issues or chronic medical problems, these are probably not your best option. ° °No Primary Care Doctor: °- Call Health Connect at  832-8000 - they can help you  locate a primary care doctor that  accepts your insurance, provides certain services, etc. °- Physician Referral Service- 1-800-533-3463 ° °Chronic Pain Problems: °Organization         Address  Phone   Notes  °[facility]  ([phone] Patients need to be referred by their primary care doctor.  ° °Medication Assistance: °Organization         Address  Phone   Notes  °[place] [facility] [address] °[place], [place] [address] (336) 641-8030 --Must be a resident of [place] °-- Must have NO insurance coverage whatsoever (no Medicaid/ Medicare, etc.) °-- The pt. MUST have a primary care doctor that directs their care regularly and follows them in the community °  °[facility]ist  (866) 331-1348   °[employer]  (888) 892-1162   ° °Agencies that provide inexpensive medical care: °Organization         Address  Phone   Notes  °[facility]  ([phone]) [phone]   °[facility]    (336) 832-7272   °[facility] [address] °[place], [place] [address] (336) [phone]   °[facility] [address], °[place] (336) [phone]   °Planned Parenthood    (336) 373-0678   °[facility]    (336) 272-1050   °[facility] ° [address], [place] Phone:  (336)   336) [phone], Fax:  ([phone] Hours of Operation:  9 am - 6 pm, M-F.  Also accepts Medicaid/Medicare and self-pay.  [facility] for [place], [address], [place] Phone: [phone], Fax: [phone]. Hours of Operation:  8:30 am - 5:30 pm, M-F.  Also accepts Medicaid and self-pay.  [facility] [place] [address] [place] Phone: [phone]   [place], [place], [place] [phone], Ext. [phone] Mondays & Thursdays: 7-9 AM.  First 15 patients are seen on a first come, first serve basis.    [place]  Providers:  Organization         Address  Phone   Notes  [facility] [address], [place] [phone] Also accepts self-pay patients.  [facility] [address], [place]  [phone]   [place], [address], [place] [phone]   [facility] [address], [place] [phone]   Lucianne Lei [address], [place]   [phone] Only accepts [place] Access [place] patients after they have their name applied to their card.   Self-Pay (no insurance) in [facility]:  Organization         Address  Phone   Notes  Sickle Cell Patients, [facility] [place] [phone]   [facility] [place] [phone]   [facility] [place]  [place], [place], [place] [phone]   [facility]/Dr. Osei-Bonsu  [address] [place] or [address], [place] [phone] Phone number for both [place] and [place] locations is the same.  [facility] [address], [place] [phone]   [facility] [address] [place] or [address] [phone]   [facility] [address] [place] [phone], phone; [phone], fax Sees patients 1st and 3rd Saturday of every month.  Must not qualify for public or private insurance (i.e. Medicaid, Medicare, [place] Health Choice, Veterans' Benefits)  Household income should be no more than 200% of the poverty level The clinic cannot treat you if you are pregnant or think you are pregnant  Sexually transmitted diseases are not treated at the clinic.    Dental Care: Organization         Address  Phone  Notes  [facility] of [facility] [phone] Accepts children up to age 2 who are enrolled in [place] or [place]; pregnant women with a Medicaid card; and children who have applied for Medicaid or [place] Health Choice, but were declined, whose parents can pay a reduced fee at time of service.  [facility] Department of [facility]  [address], [place] [phone] Accepts children up to age 72 who are enrolled in [place] or [place]; pregnant women with a Medicaid card; and children who have applied for Medicaid or [place] Health Choice, but were declined, whose parents can pay a reduced fee at time of service.  [facility]  [place] [phone] Patients are seen by appointment only. Walk-ins  are not accepted. [place] will see patients 76 years of age and older. Monday - Tuesday (8am-5pm) Most Wednesdays (8:30-5pm) $30 per visit, cash only  [facility]  [address], [facility] [phone] Patients are seen by appointment only. Walk-ins are not accepted. [place] will see patients 32 years of age and older. One Wednesday Evening (Monthly: Volunteer Based).  $30 per visit, cash only  [place]  [phone] for adults; Children under age 64, call Graduate Pediatric Dentistry at [phone]. Children aged 51-14, please call [phone] to request a pediatric application.  Dental services are provided in all areas of dental care including fillings, crowns and bridges, complete and partial dentures, implants, gum treatment, root canals, and extractions. Preventive care is also provided. Treatment is provided to both adults and children. Patients are selected via a lottery and there is often a waiting list.   [facility] [address] [place]  [phone] [email or web address]   [facility]  [address] [place], [place] [phone], Ext. [phone] Second and Fourth Thursday of each month, opens at 6:30 AM; Clinic ends at 9 AM.  Patients are seen on a first-come first-served basis, and a limited number are seen during each clinic.   [facility]  [address] Hillard Danker [place], [place] [phone]   Eligibility Requirements You must have lived in [place], [place], or [place] counties for at least the last three months.   You cannot be eligible for state or federal sponsored [employer], including [employer], [place], or [employer].   You generally cannot be eligible for healthcare insurance through your employer.    How to apply: Eligibility screenings are held every Tuesday and Wednesday afternoon from 1:00 pm until 4:00 pm. You do not need an appointment for the interview!  [facility] [address], [place], [place]   [place]  [place] Department  [place]  [phone]    Behavioral Health Resources in the Community: Intensive Outpatient Programs Organization         Address  Phone  Notes  [place]. [address], [place], [place] [phone]   [facility]patient [address], [place], [place]   [facility]: [facility] [address], [place], [place]   [place] [address]  [place], [place] or [phone]   Substance Abuse Resources Organization         Address  Phone  Notes  [facility] Drug Services  [phone]   [place]  [phone]   [facility] [place]   Chinita Pester  [phone]   Residential & Outpatient Substance Abuse Program  (712)443-1684   Psychological Services Organization         Address  Phone  Notes  [facility] [place]  [place]  ([phone]   [place] [address], [place] [phone] or [phone]    Mobile Crisis Teams Organization         Address  Phone  Notes  [facility], Mobile Crisis Care Unit  ([phone]   [facility]  [address] [place], [place]   [facility] [address] [place] [phone]  Self-Help/Support Groups Organization         Address  Phone             Notes  [facility] [place] - variety of support groups  [place] Call for more information  Narcotics Anonymous (NA), [facility] [address], [employer] [place]  2 meetings at this location   Special educational needs teacher         Address  Phone  Notes  [facility] Residential Treatment [place],    [place]  [phone]   [facility]  [address], [place] [phone], [place], [place]   [place], [place] [phone] Admissions: 8am-3pm M-F  Incentives Substance [place] [address]    [place], [place] [phone]   [facility] [address] [place], [place], [place]   [facility] [address]  [place], [place]   [facility] - Intensive Outpatient [address] Kristeen [address], [place], [place]   [facility] ([place] [address]  [place], [place] [phone] or [phone]   [facility] ([facility]) [address], [place], [place] Accepts Medicaid  [facility] [address],  [place] [phone] Substance Abuse/Addiction Treatment   [facility] Organization         Address  Phone  Notes  [facility]  [phone]   Domenic Schwab, PhD [address] Arlis Porta [place], [place]   [phone] or [phone]    [place], [place] [phone]   [facility] [address], [place], [place] [phone] Insurance/Medicaid/sponsorship through [facility] [address], [address]                                    [place], [place] [phone] [place] [address] [place] [address], [place] [phone]    Dr. Adele Schilder  [phone]   [facility] Dept. 1) [address] [place] 2) [place] 3)  [place] [address], [place] [phone]  [phone]   [place] [phone] or [phone] (After Hours)
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
## 2014-06-03 ENCOUNTER — Emergency Department (HOSPITAL_COMMUNITY)
Admission: EM | Admit: 2014-06-03 | Discharge: 2014-06-03 | Disposition: A | Discharge status: Home / Self Care | Payer: No Typology Code available for payment source | Attending: Emergency Medicine | Admitting: Emergency Medicine

## 2014-06-03 ENCOUNTER — Encounter (HOSPITAL_COMMUNITY): Payer: Self-pay | Admitting: Emergency Medicine

## 2014-06-03 DIAGNOSIS — Z872 Personal history of diseases of the skin and subcutaneous tissue: Secondary | ICD-10-CM | POA: Insufficient documentation

## 2014-06-03 DIAGNOSIS — R110 Nausea: Secondary | ICD-10-CM | POA: Insufficient documentation

## 2014-06-03 DIAGNOSIS — Z79899 Other long term (current) drug therapy: Secondary | ICD-10-CM | POA: Insufficient documentation

## 2014-06-03 DIAGNOSIS — M5441 Lumbago with sciatica, right side: Secondary | ICD-10-CM

## 2014-06-03 DIAGNOSIS — M5430 Sciatica, unspecified side: Secondary | ICD-10-CM | POA: Insufficient documentation

## 2014-06-03 DIAGNOSIS — F17200 Nicotine dependence, unspecified, uncomplicated: Secondary | ICD-10-CM | POA: Insufficient documentation

## 2014-06-03 DIAGNOSIS — M25559 Pain in unspecified hip: Secondary | ICD-10-CM | POA: Insufficient documentation

## 2014-06-03 MED ORDER — HYDROCODONE-ACETAMINOPHEN 5-325 MG PO TABS: 1.0000 | ORAL_TABLET | ORAL | Status: DC | PRN
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
## 2014-06-03 NOTE — Discharge Instructions (Signed)
Sciatica Sciatica is pain, weakness, numbness, or tingling along the path of the sciatic nerve. The nerve starts in the lower back and runs down the back of each leg. The nerve controls the muscles in the lower leg and in the back of the knee, while also providing sensation to the back of the thigh, lower leg, and the sole of your foot. Sciatica is a symptom of another medical condition. For instance, nerve damage or certain conditions, such as a herniated disk or bone spur on the spine, pinch or put pressure on the sciatic nerve. This causes the pain, weakness, or other sensations normally associated with sciatica. Generally, sciatica only affects one side of the body. CAUSES   Herniated or slipped disc.  Degenerative disk disease.  A pain disorder involving the narrow muscle in the buttocks (piriformis syndrome).  Pelvic injury or fracture.  Pregnancy.  Tumor (rare). SYMPTOMS  Symptoms can vary from mild to very severe. The symptoms usually travel from the low back to the buttocks and down the back of the leg. Symptoms can include:  Mild tingling or dull aches in the lower back, leg, or hip.  Numbness in the back of the calf or sole of the foot.  Burning sensations in the lower back, leg, or hip.  Sharp pains in the lower back, leg, or hip.  Leg weakness.  Severe back pain inhibiting movement. These symptoms may get worse with coughing, sneezing, laughing, or prolonged sitting or standing. Also, being overweight may worsen symptoms. DIAGNOSIS  Your caregiver will perform a physical exam to look for common symptoms of sciatica. He or she may ask you to do certain movements or activities that would trigger sciatic nerve pain. Other tests may be performed to find the cause of the sciatica. These may include:  Blood tests.  X-rays.  Imaging tests, such as an MRI or CT scan. TREATMENT  Treatment is directed at the cause of the sciatic pain. Sometimes, treatment is not necessary  and the pain and discomfort goes away on its own. If treatment is needed, your caregiver may suggest:  Over-the-counter medicines to relieve pain.  Prescription medicines, such as anti-inflammatory medicine, muscle relaxants, or narcotics.  Applying heat or ice to the painful area.  Steroid injections to lessen pain, irritation, and inflammation around the nerve.  Reducing activity during periods of pain.  Exercising and stretching to strengthen your abdomen and improve flexibility of your spine. Your caregiver may suggest losing weight if the extra weight makes the back pain worse.  Physical therapy.  Surgery to eliminate what is pressing or pinching the nerve, such as a bone spur or part of a herniated disk. HOME CARE INSTRUCTIONS   Only take over-the-counter or prescription medicines for pain or discomfort as directed by your caregiver.  Apply ice to the affected area for 20 minutes, 3-4 times a day for the first 48-72 hours. Then try heat in the same way.  Exercise, stretch, or perform your usual activities if these do not aggravate your pain.  Attend physical therapy sessions as directed by your caregiver.  Keep all follow-up appointments as directed by your caregiver.  Do not wear high heels or shoes that do not provide proper support.  Check your mattress to see if it is too soft. A firm mattress may lessen your pain and discomfort. SEEK IMMEDIATE MEDICAL CARE IF:   You lose control of your bowel or bladder (incontinence).  You have increasing weakness in the lower back, pelvis, buttocks,   or legs.  You have redness or swelling of your back.  You have a burning sensation when you urinate.  You have pain that gets worse when you lie down or awakens you at night.  Your pain is worse than you have experienced in the past.  Your pain is lasting longer than 4 weeks.  You are suddenly losing weight without reason. MAKE SURE YOU:  Understand these  instructions.  Will watch your condition.  Will get help right away if you are not doing well or get worse. Document Released: 10/26/2001 Document Revised: 05/02/2012 Document Reviewed: 03/12/2012 ExitCare Patient Information 2015 [employer]. This information is not intended to replace advice given to you by your health care provider. Make sure you discuss any questions you have with your health care provider.  
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
## 2014-06-06 NOTE — ED Provider Notes (Signed)
Medical screening examination/treatment/procedure(s) were performed by non-physician practitioner and as supervising physician I was immediately available for consultation/collaboration.   Neta Ehlers, MD 06/06/14 [phone]

## 2014-06-26 ENCOUNTER — Emergency Department (HOSPITAL_COMMUNITY): Payer: Self-pay

## 2014-06-26 ENCOUNTER — Emergency Department (HOSPITAL_COMMUNITY)
Admission: EM | Admit: 2014-06-26 | Discharge: 2014-06-26 | Disposition: A | Discharge status: Home / Self Care | Payer: Self-pay | Attending: Emergency Medicine | Admitting: Emergency Medicine

## 2014-06-26 ENCOUNTER — Encounter (HOSPITAL_COMMUNITY): Payer: Self-pay | Admitting: Emergency Medicine

## 2014-06-26 DIAGNOSIS — Z872 Personal history of diseases of the skin and subcutaneous tissue: Secondary | ICD-10-CM | POA: Insufficient documentation

## 2014-06-26 DIAGNOSIS — R05 Cough: Secondary | ICD-10-CM | POA: Insufficient documentation

## 2014-06-26 DIAGNOSIS — Z79899 Other long term (current) drug therapy: Secondary | ICD-10-CM | POA: Insufficient documentation

## 2014-06-26 DIAGNOSIS — J069 Acute upper respiratory infection, unspecified: Secondary | ICD-10-CM | POA: Insufficient documentation

## 2014-06-26 DIAGNOSIS — F17200 Nicotine dependence, unspecified, uncomplicated: Secondary | ICD-10-CM | POA: Insufficient documentation

## 2014-06-26 DIAGNOSIS — R059 Cough, unspecified: Secondary | ICD-10-CM | POA: Insufficient documentation

## 2014-06-26 DIAGNOSIS — IMO0001 Reserved for inherently not codable concepts without codable children: Secondary | ICD-10-CM | POA: Insufficient documentation

## 2014-06-26 DIAGNOSIS — R197 Diarrhea, unspecified: Secondary | ICD-10-CM | POA: Insufficient documentation

## 2014-06-26 MED ORDER — ACETAMINOPHEN 325 MG PO TABS
650.0000 mg | ORAL_TABLET | Freq: Once | ORAL | Status: AC
  Administered 2014-06-26: 650 mg via ORAL
  Filled 2014-06-26: qty 2

## 2014-06-26 MED ORDER — BENZONATATE 100 MG PO CAPS: 100.0000 mg | ORAL_CAPSULE | Freq: Three times a day (TID) | ORAL | Status: DC

## 2014-06-26 NOTE — Discharge Instructions (Signed)
°Emergency Department Resource Guide °1) Find a Doctor and Pay Out of Pocket °Although you won't have to find out who is covered by your insurance plan, it is a good idea to ask around and get recommendations. You will then need to call the office and see if the doctor you have chosen will accept you as a new patient and what types of options they offer for patients who are self-pay. Some doctors offer discounts or will set up payment plans for their patients who do not have insurance, but you will need to ask so you aren't surprised when you get to your appointment. ° °2) Contact Your Local Health Department °Not all health departments have doctors that can see patients for sick visits, but many do, so it is worth a call to see if yours does. If you don't know where your local health department is, you can check in your phone book. The CDC also has a tool to help you locate your state's health department, and many state websites also have listings of all of their local health departments. ° °3) Find a Walk-in Clinic °If your illness is not likely to be very severe or complicated, you may want to try a walk in clinic. These are popping up all over the country in pharmacies, drugstores, and shopping centers. They're usually staffed by nurse practitioners or physician assistants that have been trained to treat common illnesses and complaints. They're usually fairly quick and inexpensive. However, if you have serious medical issues or chronic medical problems, these are probably not your best option. ° °No Primary Care Doctor: °- Call [facility] Connect at  832-8000 - they can help you locate a primary care doctor that  accepts your insurance, provides certain services, etc. °- Physician Referral Service- 1-800-533-3463 ° °Chronic Pain Problems: °Organization         Address  Phone   Notes  °[facility]  (336) 297-2271 Patients need to be referred by their primary care doctor.  ° °Medication  Assistance: °Organization         Address  Phone   Notes  °[place] [facility] [address] °[place], [place] [address] (336) 641-8030 --Must be a resident of [place] °-- Must have NO insurance coverage whatsoever (no Medicaid/ Medicare, etc.) °-- The pt. MUST have a primary care doctor that directs their care regularly and follows them in the community °  °[facility]  (866) 331-1348   °[employer]  (888) 892-1162   ° °Agencies that provide inexpensive medical care: °Organization         Address  Phone   Notes  °[facility]  (336) 832-8035   °[facility]    (336) 832-7272   °[facility] [address] °[place],  [address] (336) 832-4777   °[facility] [address], °[employer] [place] (336) 271-4999   °[facility]    (336) 373-0678   °[facility]    (336) 272-1050   °[facility] ° [address], [place] Phone:  (336) 832-4444, Fax:  (336) 832-4440 Hours of Operation:  9 am - 6 pm, M-F.  Also accepts Medicaid/Medicare and self-pay.  °[facility] ° [address], [place] Phone: (336) 832-3150, Fax: (336) [phone]. Hours of Operation:  8:30 am - 5:30 pm, M-F.  Also accepts Medicaid and self-pay.  °[facility] [place] [address], [place] Phone: (336) 878-6027   °[facility] [address], [place], [place] (336)723-1848, Ext. 123 Mondays & Thursdays: 7-9 AM.  First 15 patients are seen on a first come, first serve basis. °  ° °Medicaid-accepting [place] Providers: ° °Organization         Address  Phone   Notes  °[facility] [address], [place] (336) 641-2100 Also accepts self-pay patients.  °[facility] [address], [place] ° (336) 856-9996   °[facility] [address], [place]  (336) 288-8857   °[facility] [address] [place] [address], [place] (336) 299-7000   °Veita Bland [address], [place]  ° (336) 373-1557 Only accepts [place] Access Medicaid patients after they have their name applied to their card.  ° °Self-Pay (no insurance) in [place]: ° °Organization         Address  Phone   Notes  °Sickle Cell Patients, [facility] [address], [place] (336) 832-1970   °[facility] [address], [place] (336) 832-4400   °[facility] ° [address], [place] (336) 992-4800   °[facility]/Dr. Osei-Bonsu ° [address] [place] [address], [place] or [address], [place] (336) 841-8500 Phone number for both [place] and [place] locations is the same.  °[facility] [address], [place] (336) 299-0000   °[facility] [address] [place] [address], [place] or [address] (336) 852-7530 °(336) 878-2260   °[facility] [address], [place] (336) 350-1642, phone; (336) 294-5005, fax Sees patients 1st and 3rd Saturday of every month.  Must not qualify for public or private insurance (i.e. Medicaid, Medicare, [place] Health Choice, Veterans' Benefits) • Household income should be no more than 200% of the poverty level •The clinic cannot treat you if you are pregnant or think you are pregnant • Sexually transmitted diseases are not treated at the clinic.  ° ° °Dental Care: °Organization         Address  Phone  Notes  °[place] [facility] [address],  (336) 641-6152 Accepts children up to age 21 who are enrolled in Medicaid or [place] Health Choice; pregnant women with a Medicaid card; and children who have applied for Medicaid or [place] Health Choice, but were declined, whose parents can pay a reduced fee at time of service.  °[place]  [facility] [place]  [address], [place] (336) 641-7733 Accepts children up to age 21 who are enrolled in Medicaid or [place] Health Choice; pregnant women with a Medicaid card; and children who have applied for Medicaid or  Health Choice, but were declined, whose parents can pay a reduced fee at time of service.  °[facility] ° [address],  (336) 641-4533 Patients are seen by appointment only. Walk-ins are not accepted. [facility] will see patients 18 years of age and older. °Monday - Tuesday (8am-5pm) °Most Wednesdays (8:30-5pm) °$30 per visit, cash only  °[facility] ° [address], [place] (336) 641-4533 Patients are seen by appointment only. Walk-ins are not accepted. [facility] will see patients 18 years of age and older. °One   Wednesday Evening (Monthly: Volunteer Based).  $30 per visit, cash only  °[facility]  (919) 537-3737 for adults; Children under age 4, call Graduate Pediatric Dentistry at ([phone]) [phone]. Children aged 4-14, please call ([phone]) [phone] to request a pediatric application. ° Dental services are provided in all areas of dental care including fillings, crowns and bridges, complete and partial dentures, implants, gum treatment, root canals, and extractions. Preventive care is also provided. Treatment is provided to both adults and children. °Patients are selected via a lottery and there is often a waiting list. °  °[facility] [address], °[place] ° (336) 763-8833 [email or web address] °  °[facility] [address], [place], [place] (336)723-1848, Ext. 123 Second and Fourth Thursday of each month, opens at 6:30 AM; Clinic ends at 9 AM.  Patients are seen on a first-come first-served basis, and a limited number are seen during each clinic.  ° °[facility] ° [address], [place], [place] (336) 723-7904    Eligibility Requirements °You must have lived in [place], [place], or [place] counties for at least the last three months. °  You cannot be eligible for state or federal sponsored healthcare insurance, including Veterans Administration, Medicaid, or Medicare. °  You generally cannot be eligible for healthcare insurance through your employer.  °  How to apply: °Eligibility screenings are held every Tuesday and Wednesday afternoon from 1:00 pm until 4:00 pm. You do not need an appointment for the interview!  °[facility] [address], [place], [place] 336-631-2330   °[facility]  336-342-8273   °[facility]  336-703-3100   °[facility]  336-570-6415   ° °Behavioral Health Resources in the Community: °Intensive Outpatient Programs °Organization         Address  Phone  Notes  °[facility] [address], [place], [place] 336-878-6098   °[facility] [address], [place], [place] 336-832-9800   °[facility] [address], [place], [place] ° 336-882-2125   °[facility] [address],  °[place], [place] 1-800-853-5163 or 336-641-4981   °Substance Abuse Resources °Organization         Address  Phone  Notes  °[facility]  336-882-2125   °[facility]  336-784-9470   °[facility]  336-285-9073   °[facility]  336-845-3988   °Residential & Outpatient Substance Abuse Program  1-800-659-3381   °Psychological Services °Organization         Address  Phone  Notes  ° Health  336- 832-9600   °[facility]  336- 378-7881   °[facility] [address], [place] 1-800-853-5163 or 336-641-4981   ° °Mobile Crisis Teams °Organization         Address  Phone  Notes  °[facility], Mobile Crisis Care Unit  1-877-626-1772   °Assertive °[facility] ° [address]  [place], [place] 336-834-9664   °Sharon DeEsch [address] °[place] 336-554-5454   ° °Self-Help/Support Groups °Organization         Address  Phone             Notes  °[facility] - variety of support groups  336- 373-1402 Call for more information  °Narcotics Anonymous (NA), [facility] [address], °[place]  2 meetings at this location  ° °  Residential Treatment Programs Organization         Address  Phone  Notes  [facility] [address]    [place]  1-(989)194-1233   [facility]  [address], [place] [address], [place], [place]   [place], [place] 717-430-8485 Admissions: 8am-3pm M-F  [facility] [address],    [place], [place] [phone]   [facility] [address] [place], [place], [place]   [facility] [address]  [place], [place]   [facility] - Intensive Outpatient [address], [place], [place]   [facility] ([place] [address]  [place], [place] 1-810-498-4632 or [phone]   [facility] [address], [place], [place] Accepts Medicaid  [facility] [address]  [place] 1-(978)291-6806 Substance Abuse/Addiction Treatment   [facility]         Address  Phone  Notes  [facility]  (667) 258-9109   Domenic Schwab, PhD [address] [place], [place]   367-041-8684 or (403) 291-6503   [facility] [place], [place] 718-614-8364   [facility] [address] [place], [place] (919) [phone] Insurance/Medicaid/sponsorship through [facility] [address]                                    [place], [place] [phone] [facility] [address], [place] ([phone]    Dr. Adele Schilder  [phone]   [facility] Dept. 1) [address] [place] 2) [place] 3)  [address], [place] [phone]  [phone]   [place] (434)[phone] or [phone] (After Hours)       Upper Respiratory Infection, Adult An upper respiratory infection (URI) is also sometimes known as the common cold. The upper respiratory tract includes the nose, sinuses, throat, trachea, and bronchi. Bronchi are the airways leading to the lungs. Most people improve within 1 week, but symptoms can last up to 2 weeks. A residual cough may last even longer.  CAUSES Many different viruses can infect the tissues lining the upper respiratory tract. The tissues become irritated and inflamed and often become very moist. Mucus production is also common. A cold is contagious. You can easily spread the virus to others by oral contact. This includes kissing, sharing a glass, coughing, or sneezing. Touching your mouth or nose and then touching a surface, which is then touched by another person, can also spread the virus. SYMPTOMS  Symptoms typically develop 1 to 3 days after you come in contact with a cold virus. Symptoms vary from person to person. They may include:  Runny nose.  Sneezing.  Nasal congestion.  Sinus irritation.  Sore throat.  Loss of voice (laryngitis).  Cough.  Fatigue.  Muscle aches.  Loss of appetite.  Headache.  Low-grade fever. DIAGNOSIS  You might diagnose your own cold based on familiar symptoms, since most people get a cold 2 to 3 times a year. Your caregiver can confirm this based on your exam. Most importantly,  your caregiver can check that your symptoms are not due to another disease such as strep throat, sinusitis, pneumonia, asthma, or epiglottitis. Blood tests,  throat tests, and X-rays are not necessary to diagnose a common cold, but they may sometimes be helpful in excluding other more serious diseases. Your caregiver will decide if any further tests are required. RISKS AND COMPLICATIONS  You may be at risk for a more severe case of the common cold if you smoke cigarettes, have chronic heart disease (such as heart failure) or lung disease (such as asthma), or if you have a weakened immune system. The very young and very old are also at risk for more serious infections. Bacterial sinusitis, middle ear infections, and bacterial pneumonia can complicate the common cold. The common cold can worsen asthma and chronic obstructive pulmonary disease (COPD). Sometimes, these complications can require emergency medical care and may be life-threatening. PREVENTION  The best way to protect against getting a cold is to practice good hygiene. Avoid oral or hand contact with people with cold symptoms. Wash your hands often if contact occurs. There is no clear evidence that vitamin C, vitamin E, echinacea, or exercise reduces the chance of developing a cold. However, it is always recommended to get plenty of rest and practice good nutrition. TREATMENT  Treatment is directed at relieving symptoms. There is no cure. Antibiotics are not effective, because the infection is caused by a virus, not by bacteria. Treatment may include:  Increased fluid intake. Sports drinks offer valuable electrolytes, sugars, and fluids.  Breathing heated mist or steam (vaporizer or shower).  Eating chicken soup or other clear broths, and maintaining good nutrition.  Getting plenty of rest.  Using gargles or lozenges for comfort.  Controlling fevers with ibuprofen or acetaminophen as directed by your caregiver.  Increasing usage of your inhaler if you have asthma. Zinc gel and zinc lozenges, taken in the first 24 hours of the common cold, can shorten the duration and lessen the severity of  symptoms. Pain medicines may help with fever, muscle aches, and throat pain. A variety of non-prescription medicines are available to treat congestion and runny nose. Your caregiver can make recommendations and may suggest nasal or lung inhalers for other symptoms.  HOME CARE INSTRUCTIONS   Only take over-the-counter or prescription medicines for pain, discomfort, or fever as directed by your caregiver.  Use a warm mist humidifier or inhale steam from a shower to increase air moisture. This may keep secretions moist and make it easier to breathe.  Drink enough water and fluids to keep your urine clear or pale yellow.  Rest as needed.  Return to work when your temperature has returned to normal or as your caregiver advises. You may need to stay home longer to avoid infecting others. You can also use a face mask and careful hand washing to prevent spread of the virus. SEEK MEDICAL CARE IF:   After the first few days, you feel you are getting worse rather than better.  You need your caregiver's advice about medicines to control symptoms.  You develop chills, worsening shortness of breath, or brown or red sputum. These may be signs of pneumonia.  You develop yellow or brown nasal discharge or pain in the face, especially when you bend forward. These may be signs of sinusitis.  You develop a fever, swollen neck glands, pain with swallowing, or white areas in the back of your throat. These may be signs of strep throat. SEEK IMMEDIATE MEDICAL CARE IF:  You have a fever.  You develop severe or persistent headache, ear pain, sinus pain, or chest pain.  You develop wheezing, a prolonged cough, cough up blood, or have a change in your usual mucus (if you have chronic lung disease).  You develop sore muscles or a stiff neck. Document Released: 04/27/2001 Document Revised: 01/24/2012 Document Reviewed: 02/06/2014 [facility] Patient Information 2015 [place], [place]. This information is not intended  to replace advice given to you by your health care provider. Make sure you discuss any questions you have with your health care provider.

## 2014-06-26 NOTE — ED Provider Notes (Signed)
CSN: [id number]     Arrival date & time 06/26/14  0607 History   First MD Initiated Contact with Patient 06/26/14 [phone]     Chief Complaint  Patient presents with  . URI     (Consider location/radiation/quality/duration/timing/severity/associated sxs/prior Treatment) HPI  Jordan Pruitt is a 38 y.o. female who complains of URI symptoms for the past 2 days. She endorses, chills, productive cough with green sputum, SOB, sore throat, general muscle aches, and diarrhea - non bloody.  She says she took Tyl PM and Mucinex but they did not work. No sick contacts.  No fevers, dysuria, abd pain, weakness, rashes No recent admissions, no hx of asthma or COPD No reported exposure to insect bites. Smokes 1/2 ppd, 1 beer/week, no illicit drug use  Past Medical History  Diagnosis Date  . Paronychia    Past Surgical History  Procedure Laterality Date  . Tubal ligation    . Cesarean section      x4   No family history on file. History  Substance Use Topics  . Smoking status: Current Every Day Smoker -- 0.50 packs/day    Types: Cigarettes  . Smokeless tobacco: Not on file  . Alcohol Use: Yes   OB History   Grav Para Term Preterm Abortions TAB SAB Ect Mult Living                 Review of Systems  Constitutional: Positive for chills. Negative for fever.  HENT: Positive for congestion.   Eyes: Negative for visual disturbance.  Respiratory: Positive for cough and shortness of breath.   Cardiovascular: Negative for palpitations.  Gastrointestinal: Positive for nausea and diarrhea. Negative for abdominal pain.  Genitourinary: Negative for dysuria.  Musculoskeletal: Positive for myalgias.  Skin: Negative for rash.  Neurological: Negative for weakness.      Allergies  Ibuprofen  Home Medications   Prior to Admission medications   Medication Sig Start Date End Date Taking? Authorizing Provider  diphenhydramine-acetaminophen (TYLENOL PM) 25-500 MG TABS Take 2 tablets by mouth at  bedtime as needed (sleep).   Yes Historical Provider, MD  ferrous sulfate 325 (65 FE) MG tablet Take 325 mg by mouth daily with breakfast.   Yes Historical Provider, MD  Multiple Vitamins-Minerals (MULTIVITAMIN WITH MINERALS) tablet Take 1 tablet by mouth daily.   Yes Historical Provider, MD  benzonatate (TESSALON) 100 MG capsule Take 1 capsule (100 mg total) by mouth every 8 (eight) hours. 06/26/14   Montine Circle, PA-C   BP 134/62  Pulse 90  Temp(Src) 97.9 F (36.6 C) (Oral)  Resp 18  SpO2 99%  LMP 06/14/2014 Physical Exam  Nursing note and vitals reviewed. Constitutional: She is oriented to person, place, and time. She appears well-developed and well-nourished.  HENT:  Head: Normocephalic.  Mouth/Throat: Oropharynx is clear and moist. No oropharyngeal exudate.  Eyes: Pupils are equal, round, and reactive to light. Right eye exhibits no discharge. Left eye exhibits no discharge.  Neck: Normal range of motion. Neck supple.  Cardiovascular: Normal rate, regular rhythm and normal heart sounds.   Pulmonary/Chest: Effort normal. She exhibits no tenderness.  Adventitious lung sounds in RLL with inspiration, all other fields CTA  Abdominal: There is no tenderness.  Neurological: She is alert and oriented to person, place, and time.  Skin: Skin is warm and dry. No rash noted.    ED Course  Procedures (including critical care time) Labs Review Labs Reviewed - No data to display  Imaging Review Dg Chest 2  View  06/26/2014   CLINICAL DATA:  Chest pain and shortness of breath. With upper respiratory symptoms  EXAM: CHEST  2 VIEW  COMPARISON:  PA and lateral chest of March 01, 2014  FINDINGS: The lungs are adequately inflated. There is no focal infiltrate. There are coarse infrahilar lung markings on the right which are stable. The heart and mediastinal structures are normal. There is no pleural effusion.  IMPRESSION: There is no acute cardiopulmonary abnormality.   Electronically Signed    By: David  Martinique   On: 06/26/2014 09:15     EKG Interpretation None      MDM  Pt resting comfortably in ED Vitals stable CXR shows no evidence of cardiopulmonary abnormality- no focal consolidations Sx consistent with URI Will DC with cough suppressant and advise f/u with PCP, resource guide given Final diagnoses:  URI (upper respiratory infection)   Meds given in ED:  Medications  acetaminophen (TYLENOL) tablet 650 mg (650 mg Oral Given 06/26/14 1010)    New Prescriptions   BENZONATATE (TESSALON) 100 MG CAPSULE    Take 1 capsule (100 mg total) by mouth every 8 (eight) hours.   Prior to patient discharge, I discussed and reviewed this case with Dr. Edrick Kins, PA-C 06/26/14 1022

## 2014-06-26 NOTE — ED Notes (Signed)
Pt c/o cough, chest congestion, headache, body aches for two days.

## 2014-06-26 NOTE — ED Provider Notes (Signed)
Prescription written for tessalon.    [place], PA-C, not able to write for medicaid yet.  Montine Circle, PA-C 06/26/14 1019

## 2014-06-27 NOTE — ED Provider Notes (Signed)
Medical screening examination/treatment/procedure(s) were performed by non-physician practitioner and as supervising physician I was immediately available for consultation/collaboration.   EKG Interpretation None        Francine Graven, DO 06/27/14 1710

## 2014-07-03 NOTE — ED Provider Notes (Signed)
Medical screening examination/treatment/procedure(s) were performed by non-physician practitioner and as supervising physician I was immediately available for consultation/collaboration.   EKG Interpretation None        Julianne Rice, MD 07/03/14 [phone]

## 2014-10-24 ENCOUNTER — Other Ambulatory Visit: Payer: Self-pay

## 2014-10-24 ENCOUNTER — Inpatient Hospital Stay (HOSPITAL_COMMUNITY)
Admission: EM | Admit: 2014-10-24 | Discharge: 2014-10-26 | DRG: 872 | Disposition: A | Discharge status: Home / Self Care | Payer: Medicaid Other | Attending: Internal Medicine | Admitting: Internal Medicine

## 2014-10-24 ENCOUNTER — Encounter (HOSPITAL_COMMUNITY): Payer: Self-pay | Admitting: Emergency Medicine

## 2014-10-24 DIAGNOSIS — L03116 Cellulitis of left lower limb: Secondary | ICD-10-CM | POA: Diagnosis present

## 2014-10-24 DIAGNOSIS — E669 Obesity, unspecified: Secondary | ICD-10-CM | POA: Diagnosis present

## 2014-10-24 DIAGNOSIS — R000 Tachycardia, unspecified: Secondary | ICD-10-CM | POA: Diagnosis present

## 2014-10-24 DIAGNOSIS — L0390 Cellulitis, unspecified: Secondary | ICD-10-CM | POA: Diagnosis present

## 2014-10-24 DIAGNOSIS — D72829 Elevated white blood cell count, unspecified: Secondary | ICD-10-CM

## 2014-10-24 DIAGNOSIS — R509 Fever, unspecified: Secondary | ICD-10-CM | POA: Diagnosis present

## 2014-10-24 DIAGNOSIS — N920 Excessive and frequent menstruation with regular cycle: Secondary | ICD-10-CM | POA: Diagnosis present

## 2014-10-24 DIAGNOSIS — I471 Supraventricular tachycardia: Secondary | ICD-10-CM

## 2014-10-24 DIAGNOSIS — Z6841 Body Mass Index (BMI) 40.0 and over, adult: Secondary | ICD-10-CM | POA: Diagnosis not present

## 2014-10-24 DIAGNOSIS — D509 Iron deficiency anemia, unspecified: Secondary | ICD-10-CM | POA: Diagnosis present

## 2014-10-24 DIAGNOSIS — D649 Anemia, unspecified: Secondary | ICD-10-CM | POA: Diagnosis present

## 2014-10-24 DIAGNOSIS — M79609 Pain in unspecified limb: Secondary | ICD-10-CM

## 2014-10-24 DIAGNOSIS — F17210 Nicotine dependence, cigarettes, uncomplicated: Secondary | ICD-10-CM | POA: Diagnosis present

## 2014-10-24 DIAGNOSIS — A419 Sepsis, unspecified organism: Principal | ICD-10-CM | POA: Diagnosis present

## 2014-10-24 DIAGNOSIS — R6510 Systemic inflammatory response syndrome (SIRS) of non-infectious origin without acute organ dysfunction: Secondary | ICD-10-CM

## 2014-10-24 HISTORY — DX: Anemia, unspecified: D64.9

## 2014-10-24 HISTORY — DX: Excessive and frequent menstruation with regular cycle: N92.0

## 2014-10-24 LAB — COMPREHENSIVE METABOLIC PANEL
ALT: 10 U/L (ref 0–35)
AST: 17 U/L (ref 0–37)
Albumin: 3.6 g/dL (ref 3.5–5.2)
Alkaline Phosphatase: 67 U/L (ref 39–117)
Anion gap: 15 (ref 5–15)
BILIRUBIN TOTAL: 0.6 mg/dL (ref 0.3–1.2)
BUN: 7 mg/dL (ref 6–23)
CALCIUM: 9.3 mg/dL (ref 8.4–10.5)
CO2: 20 mEq/L (ref 19–32)
CREATININE: 0.69 mg/dL (ref 0.50–1.10)
Chloride: 98 mEq/L (ref 96–112)
GFR calc Af Amer: >90 mL/min (ref >90)
GFR calc non Af Amer: >90 mL/min (ref >90)
GLUCOSE: 105 mg/dL — AB (ref 70–99)
Potassium: 3.6 mEq/L — ABNORMAL LOW (ref 3.7–5.3)
SODIUM: 133 meq/L — AB (ref 137–147)
Total Protein: 7.7 g/dL (ref 6.0–8.3)

## 2014-10-24 LAB — CBC
HCT: 20.4 % — ABNORMAL LOW (ref 36.0–46.0)
Hemoglobin: 5.7 g/dL — CL (ref 12.0–15.0)
MCH: 16.9 pg — ABNORMAL LOW (ref 26.0–34.0)
MCHC: 27.9 g/dL — ABNORMAL LOW (ref 30.0–36.0)
MCV: 60.4 fL — AB (ref 78.0–100.0)
PLATELETS: 435 10*3/uL — AB (ref 150–400)
RBC: 3.38 MIL/uL — ABNORMAL LOW (ref 3.87–5.11)
RDW: 20.7 % — AB (ref 11.5–15.5)
WBC: 14.1 10*3/uL — ABNORMAL HIGH (ref 4.0–10.5)

## 2014-10-24 LAB — CBC WITH DIFFERENTIAL/PLATELET
Basophils Absolute: 0 10*3/uL (ref 0.0–0.1)
Basophils Relative: 0 % (ref 0–1)
EOS ABS: 0 10*3/uL (ref 0.0–0.7)
EOS PCT: 0 % (ref 0–5)
HEMATOCRIT: 22.3 % — AB (ref 36.0–46.0)
HEMOGLOBIN: 6.4 g/dL — AB (ref 12.0–15.0)
Lymphocytes Relative: 5 % — ABNORMAL LOW (ref 12–46)
Lymphs Abs: 0.8 10*3/uL (ref 0.7–4.0)
MCH: 17.6 pg — AB (ref 26.0–34.0)
MCHC: 28.7 g/dL — AB (ref 30.0–36.0)
MCV: 61.3 fL — ABNORMAL LOW (ref 78.0–100.0)
MONO ABS: 0.7 10*3/uL (ref 0.1–1.0)
Monocytes Relative: 4 % (ref 3–12)
NEUTROS ABS: 15.1 10*3/uL — AB (ref 1.7–7.7)
Neutrophils Relative %: 91 % — ABNORMAL HIGH (ref 43–77)
Platelets: 475 10*3/uL — ABNORMAL HIGH (ref 150–400)
RBC: 3.64 MIL/uL — AB (ref 3.87–5.11)
RDW: 21.1 % — ABNORMAL HIGH (ref 11.5–15.5)
WBC: 16.6 10*3/uL — ABNORMAL HIGH (ref 4.0–10.5)

## 2014-10-24 LAB — URINALYSIS, ROUTINE W REFLEX MICROSCOPIC
Bilirubin Urine: NEGATIVE
GLUCOSE, UA: NEGATIVE mg/dL
HGB URINE DIPSTICK: NEGATIVE
KETONES UR: NEGATIVE mg/dL
Leukocytes, UA: NEGATIVE
Nitrite: NEGATIVE
PROTEIN: NEGATIVE mg/dL
Specific Gravity, Urine: 1.025 (ref 1.005–1.030)
UROBILINOGEN UA: 1 mg/dL (ref 0.0–1.0)
pH: 6.5 (ref 5.0–8.0)

## 2014-10-24 LAB — HEMOGLOBIN AND HEMATOCRIT, BLOOD
HCT: 27.3 % — ABNORMAL LOW (ref 36.0–46.0)
Hemoglobin: 8.2 g/dL — ABNORMAL LOW (ref 12.0–15.0)

## 2014-10-24 LAB — BASIC METABOLIC PANEL
Anion gap: 13 (ref 5–15)
BUN: 5 mg/dL — AB (ref 6–23)
CO2: 20 mEq/L (ref 19–32)
CREATININE: 0.69 mg/dL (ref 0.50–1.10)
Calcium: 8.8 mg/dL (ref 8.4–10.5)
Chloride: 102 mEq/L (ref 96–112)
GFR calc Af Amer: >90 mL/min (ref >90)
GFR calc non Af Amer: >90 mL/min (ref >90)
GLUCOSE: 113 mg/dL — AB (ref 70–99)
Potassium: 3.6 mEq/L — ABNORMAL LOW (ref 3.7–5.3)
Sodium: 135 mEq/L — ABNORMAL LOW (ref 137–147)

## 2014-10-24 LAB — ABO/RH: ABO/RH(D): O POS

## 2014-10-24 LAB — PREPARE RBC (CROSSMATCH)

## 2014-10-24 LAB — POC OCCULT BLOOD, ED: Fecal Occult Bld: NEGATIVE

## 2014-10-24 LAB — IRON AND TIBC
Iron: <10 ug/dL — ABNORMAL LOW (ref 42–135)
UIBC: 348 ug/dL (ref 125–400)

## 2014-10-24 LAB — FERRITIN: FERRITIN: 6 ng/mL — AB (ref 10–291)

## 2014-10-24 LAB — GLUCOSE, CAPILLARY: Glucose-Capillary: 139 mg/dL — ABNORMAL HIGH (ref 70–99)

## 2014-10-24 LAB — VITAMIN B12: Vitamin B-12: 393 pg/mL (ref 211–911)

## 2014-10-24 LAB — RETICULOCYTES
RBC.: 3.34 MIL/uL — ABNORMAL LOW (ref 3.87–5.11)
Retic Count, Absolute: 26.7 10*3/uL (ref 19.0–186.0)
Retic Ct Pct: 0.8 % (ref 0.4–3.1)

## 2014-10-24 LAB — I-STAT CG4 LACTIC ACID, ED: Lactic Acid, Venous: 1.26 mmol/L (ref 0.5–2.2)

## 2014-10-24 LAB — PREGNANCY, URINE: Preg Test, Ur: NEGATIVE

## 2014-10-24 LAB — FOLATE: Folate: 14.4 ng/mL

## 2014-10-24 MED ORDER — IRON DEXTRAN 50 MG/ML IJ SOLN
75.0000 mg | Freq: Once | INTRAMUSCULAR | Status: AC
  Administered 2014-10-24: 75 mg via INTRAVENOUS
  Filled 2014-10-24: qty 1.5

## 2014-10-24 MED ORDER — HYDROMORPHONE HCL 1 MG/ML IJ SOLN: 0.5000 mg | INTRAMUSCULAR | Status: DC | PRN

## 2014-10-24 MED ORDER — SODIUM CHLORIDE 0.9 % IV BOLUS (SEPSIS)
1000.0000 mL | Freq: Once | INTRAVENOUS | Status: AC
Start: 2014-10-24 — End: 2014-10-24
  Administered 2014-10-24: 1000 mL via INTRAVENOUS

## 2014-10-24 MED ORDER — SODIUM CHLORIDE 0.9 % IV BOLUS (SEPSIS)
1000.0000 mL | Freq: Once | INTRAVENOUS | Status: AC
  Administered 2014-10-24: 1000 mL via INTRAVENOUS

## 2014-10-24 MED ORDER — ADULT MULTIVITAMIN W/MINERALS CH
1.0000 | ORAL_TABLET | Freq: Every day | ORAL | Status: DC
  Administered 2014-10-24 – 2014-10-26 (×3): 1 via ORAL
  Filled 2014-10-24 (×3): qty 1

## 2014-10-24 MED ORDER — SODIUM CHLORIDE 0.9 % IV SOLN
25.0000 mg | Freq: Once | INTRAVENOUS | Status: AC
  Administered 2014-10-24: 25 mg via INTRAVENOUS
  Filled 2014-10-24: qty 0.5

## 2014-10-24 MED ORDER — ACETAMINOPHEN 325 MG PO TABS
650.0000 mg | ORAL_TABLET | Freq: Once | ORAL | Status: AC
  Administered 2014-10-24: 650 mg via ORAL
  Filled 2014-10-24: qty 2

## 2014-10-24 MED ORDER — SODIUM CHLORIDE 0.9 % IV SOLN
10.0000 mL/h | Freq: Once | INTRAVENOUS | Status: AC
  Administered 2014-10-24: 10 mL/h via INTRAVENOUS

## 2014-10-24 MED ORDER — OXYCODONE HCL 5 MG PO TABS
5.0000 mg | ORAL_TABLET | ORAL | Status: DC | PRN
  Administered 2014-10-24 – 2014-10-25 (×4): 5 mg via ORAL
  Filled 2014-10-24 (×4): qty 1

## 2014-10-24 MED ORDER — ACETAMINOPHEN 325 MG PO TABS
650.0000 mg | ORAL_TABLET | Freq: Four times a day (QID) | ORAL | Status: DC | PRN
  Administered 2014-10-24 – 2014-10-25 (×5): 650 mg via ORAL
  Filled 2014-10-24 (×5): qty 2

## 2014-10-24 MED ORDER — FENTANYL CITRATE 0.05 MG/ML IJ SOLN
50.0000 ug | Freq: Once | INTRAMUSCULAR | Status: AC
  Administered 2014-10-24: 50 ug via INTRAVENOUS
  Filled 2014-10-24: qty 2

## 2014-10-24 MED ORDER — SODIUM CHLORIDE 0.9 % IV SOLN: Freq: Once | INTRAVENOUS | Status: DC

## 2014-10-24 MED ORDER — VANCOMYCIN HCL IN DEXTROSE 1-5 GM/200ML-% IV SOLN
1000.0000 mg | Freq: Three times a day (TID) | INTRAVENOUS | Status: DC
  Administered 2014-10-24: 1000 mg via INTRAVENOUS
  Filled 2014-10-24 (×3): qty 200

## 2014-10-24 MED ORDER — SODIUM CHLORIDE 0.9 % IJ SOLN
10.0000 mL | INTRAMUSCULAR | Status: DC | PRN
  Administered 2014-10-24 (×2): 10 mL
  Administered 2014-10-25: 20 mL
  Administered 2014-10-26: 10 mL
  Filled 2014-10-24 (×4): qty 40

## 2014-10-24 MED ORDER — CEFAZOLIN SODIUM-DEXTROSE 2-3 GM-% IV SOLR
2.0000 g | Freq: Three times a day (TID) | INTRAVENOUS | Status: DC
  Administered 2014-10-24 – 2014-10-26 (×6): 2 g via INTRAVENOUS
  Filled 2014-10-24 (×8): qty 50

## 2014-10-24 MED ORDER — CYANOCOBALAMIN 500 MCG PO TABS
500.0000 ug | ORAL_TABLET | Freq: Every day | ORAL | Status: DC
  Administered 2014-10-24 – 2014-10-26 (×3): 500 ug via ORAL
  Filled 2014-10-24 (×3): qty 1

## 2014-10-24 MED ORDER — ONDANSETRON HCL 4 MG PO TABS
4.0000 mg | ORAL_TABLET | Freq: Four times a day (QID) | ORAL | Status: DC | PRN
  Administered 2014-10-25: 4 mg via ORAL
  Filled 2014-10-24: qty 1

## 2014-10-24 MED ORDER — SODIUM CHLORIDE 0.9 % IV SOLN
INTRAVENOUS | Status: DC
  Administered 2014-10-25 – 2014-10-26 (×2): via INTRAVENOUS

## 2014-10-24 MED ORDER — VANCOMYCIN HCL IN DEXTROSE 1-5 GM/200ML-% IV SOLN
1000.0000 mg | Freq: Once | INTRAVENOUS | Status: AC
  Administered 2014-10-24: 1000 mg via INTRAVENOUS
  Filled 2014-10-24: qty 200

## 2014-10-24 MED ORDER — ENOXAPARIN SODIUM 40 MG/0.4ML ~~LOC~~ SOLN
40.0000 mg | SUBCUTANEOUS | Status: DC
  Administered 2014-10-25 – 2014-10-26 (×2): 40 mg via SUBCUTANEOUS
  Filled 2014-10-24 (×3): qty 0.4

## 2014-10-24 MED ORDER — PIPERACILLIN-TAZOBACTAM 3.375 G IVPB
3.3750 g | Freq: Once | INTRAVENOUS | Status: AC
  Administered 2014-10-24: 3.375 g via INTRAVENOUS
  Filled 2014-10-24: qty 50

## 2014-10-24 MED ORDER — ACETAMINOPHEN 650 MG RE SUPP: 650.0000 mg | Freq: Four times a day (QID) | RECTAL | Status: DC | PRN

## 2014-10-24 MED ORDER — FUROSEMIDE 10 MG/ML IJ SOLN: 20.0000 mg | Freq: Once | INTRAMUSCULAR | Status: DC

## 2014-10-24 MED ORDER — ALUM & MAG HYDROXIDE-SIMETH 200-200-20 MG/5ML PO SUSP
30.0000 mL | Freq: Four times a day (QID) | ORAL | Status: DC | PRN
  Administered 2014-10-25: 30 mL via ORAL
  Filled 2014-10-24: qty 30

## 2014-10-24 MED ORDER — PIPERACILLIN-TAZOBACTAM 3.375 G IVPB
3.3750 g | Freq: Three times a day (TID) | INTRAVENOUS | Status: DC
  Administered 2014-10-24: 3.375 g via INTRAVENOUS
  Filled 2014-10-24 (×3): qty 50

## 2014-10-24 MED ORDER — ONDANSETRON HCL 4 MG/2ML IJ SOLN: 4.0000 mg | Freq: Four times a day (QID) | INTRAMUSCULAR | Status: DC | PRN

## 2014-10-24 MED ORDER — FERROUS SULFATE 325 (65 FE) MG PO TABS
325.0000 mg | ORAL_TABLET | Freq: Every day | ORAL | Status: DC
  Administered 2014-10-24 – 2014-10-26 (×3): 325 mg via ORAL
  Filled 2014-10-24 (×5): qty 1

## 2014-10-24 MED ORDER — SODIUM CHLORIDE 0.9 % IV SOLN
Freq: Once | INTRAVENOUS | Status: AC
  Administered 2014-10-24: 13:00:00 via INTRAVENOUS

## 2014-10-24 MED ORDER — SODIUM CHLORIDE 0.9 % IJ SOLN: 3.0000 mL | Freq: Two times a day (BID) | INTRAMUSCULAR | Status: DC

## 2014-10-24 NOTE — Progress Notes (Signed)
*  PRELIMINARY RESULTS* Vascular Ultrasound Left lower extremity venous duplex has been completed.  Preliminary findings: no evidence of DVT  Landry Mellow, RDMS, RVT  10/24/2014, 5:41 PM

## 2014-10-24 NOTE — Progress Notes (Signed)
38 YEAR old lady admitted for sepsis from cellulitis of the left lower leg and severe iron deficiency anemia.  She was started on broad spectrum antibiotics and IV iron.  On exam:  left leg is tender, warm and erythematous.  Rest of the exam is WNL.   REPEAT H&H tonight after the two units of prbc.  Continue to monitor.   Hosie Poisson, MD [phone]

## 2014-10-24 NOTE — Care Management Note (Signed)
    Page 1 of 1   10/24/2014     1:59:15 PM CARE MANAGEMENT NOTE 10/24/2014  Patient:  Jordan Pruitt, Jordan Pruitt   Account Number:  [id number]  Date Initiated:  10/24/2014  Documentation initiated by:  Marvetta Gibbons  Subjective/Objective Assessment:   Pt admitted with sepsis/cellulitis     Action/Plan:   PTA pt lived at home   Anticipated DC Date:  10/26/2014   Anticipated DC Plan:  HOME/SELF CARE  In-house referral  [place]  CM consult  [facility]      Choice offered to / List presented to:             Status of service:  In process, will continue to follow Medicare Important Message given?   (If response is "NO", the following Medicare IM given date fields will be blank) Date Medicare IM given:   Medicare IM given by:   Date Additional Medicare IM given:   Additional Medicare IM given by:    Discharge Disposition:    Per UR Regulation:  Reviewed for med. necessity/level of care/duration of stay  If discussed at [place] of Stay Meetings, dates discussed:    Comments:  10/24/14- 1345- Marvetta Gibbons RN, BSN [phone] Spoke with pt at bedside- pt does not have insurance and no PCP- she is open to f/u at [facility]- will arrange f/u appointment closer to known d/c date- and give pt info on clinic location and phone #- have also called [facility] and they will call pt while she is here regarding any potential for Medicaid. NCM to continue to follow.

## 2014-10-24 NOTE — Progress Notes (Signed)
CRITICAL VALUE ALERT  Critical value received:  Hemoglobin  Date of notification:  10/24/14  Time of notification:  0615  Critical value read back:Yes.    Nurse who received alert:  Deboraha Sprang  MD notified (1st page):  Raliegh Ip. Schoor  Time of first page:  0618  Time of second page: 0629  Responding MD:  Lamar Blinks  Time MD responded:  [phone]

## 2014-10-24 NOTE — Progress Notes (Signed)
ANTIBIOTIC CONSULT NOTE - INITIAL  Pharmacy Consult for Vancomycin/Zosyn  Indication: rule out sepsis, ?LLE cellulitis as source  Allergies  Allergen Reactions  . Ibuprofen Itching    Itching w/ Rx 800mg ; able to take lower OTC doses without any problems    Patient Measurements: Height: 5\' 3"  (160 cm) Weight: 232 lb (105.235 kg) IBW/kg (Calculated) : 52.4  Vital Signs: Temp: 99.9 F (37.7 C) (12/10 0151) Temp Source: Oral (12/10 0151) BP: 123/54 mmHg (12/10 0200) Pulse Rate: 116 (12/10 0200)  Labs:  Recent Labs  10/24/14 0018  WBC 16.6*  HGB 6.4*  PLT 475*  CREATININE 0.69   Estimated Creatinine Clearance: 112.8 mL/min (by C-G formula based on Cr of 0.69).  Medical History: Past Medical History  Diagnosis Date  . Paronychia   . Menorrhagia   . Anemia    Assessment: Broad spectrum antibiotic coverage for r/o sepsis. Pt with LLE cellulitis as likely source, pt is febrile, elevated WBC, renal function ok, other labs as above.   Goal of Therapy:  Vancomycin trough level 15-20 mcg/ml  Plan:  -Vancomycin 1000 mg IV q8h -Zosyn 3.375G IV q8h to be infused over 4 hours -Trend WBC, temp, renal function -Drug levels as indicated   Narda Bonds 10/24/2014,3:38 AM

## 2014-10-24 NOTE — Progress Notes (Signed)
Pt peripheral IV site infiltrated and is painful to touch. Two IV team nurses and ED nurse attempted a second IV site which was unsuccessful in ED. Pt has an order to transfer 2 PRBCs. Pt HGB has now dropped from 6.4 to 5.7. RN paged on call MD. MD placed order for PICC line.   0800 Floor RN attempted IV and was successful. 1 unit PRBC transfusing currently. Pt resting in bed with call bell within reach.  Toshiko Kemler, Yisroel Ramming, RN

## 2014-10-24 NOTE — ED Notes (Signed)
IV team unsuccessful in 2nd IV start, suggest PICC Line; Pt has hx of PICC line placed; 2W RN notified;

## 2014-10-24 NOTE — Plan of Care (Signed)
Problem: Phase I Progression Outcomes Goal: Pain controlled with appropriate interventions Outcome: Completed/Met Date Met:  10/24/14 Goal: Voiding-avoid urinary catheter unless indicated Outcome: Completed/Met Date Met:  10/24/14 Goal: Hemodynamically stable Outcome: Completed/Met Date Met:  10/24/14

## 2014-10-24 NOTE — ED Provider Notes (Signed)
TIME SEEN: 1:05 AM  CHIEF COMPLAINT: Left leg pain, fever, nausea and vomiting  HPI: Patient is a 38 y.o. F prior history of left lower extremity cellulitis who presents to the emergency department with left lower extremity pain, redness and warmth that started today, fever, nausea and vomiting, not feeling well. Denies diarrhea or abdominal pain. No cough. No headache, neck pain or neck stiffness. No rash. No history of injury to the leg. States that she has had cellulitis in the past and has had to be admitted to the hospital. She is not a diabetic. She is not immunocompromised.  ROS: See HPI Constitutional:  fever  Eyes: no drainage  ENT: no runny nose   Cardiovascular:  no chest pain  Resp: no SOB  GI: no vomiting GU: no dysuria Integumentary: no rash  Allergy: no hives  Musculoskeletal: no leg swelling  Neurological: no slurred speech ROS otherwise negative  PAST MEDICAL HISTORY/PAST SURGICAL HISTORY:  Past Medical History  Diagnosis Date  . Paronychia     MEDICATIONS:  Prior to Admission medications   Medication Sig Start Date End Date Taking? Authorizing Provider  benzonatate (TESSALON) 100 MG capsule Take 1 capsule (100 mg total) by mouth every 8 (eight) hours. 06/26/14   Montine Circle, PA-C  diphenhydramine-acetaminophen (TYLENOL PM) 25-500 MG TABS Take 2 tablets by mouth at bedtime as needed (sleep).    Historical Provider, MD  ferrous sulfate 325 (65 FE) MG tablet Take 325 mg by mouth daily with breakfast.    Historical Provider, MD  Multiple Vitamins-Minerals (MULTIVITAMIN WITH MINERALS) tablet Take 1 tablet by mouth daily.    Historical Provider, MD    ALLERGIES:  Allergies  Allergen Reactions  . Ibuprofen Itching    Itching w/ Rx 800mg ; able to take lower OTC doses without any problems    SOCIAL HISTORY:  History  Substance Use Topics  . Smoking status: Current Every Day Smoker -- 0.50 packs/day    Types: Cigarettes  . Smokeless tobacco: Not on file  .  Alcohol Use: Yes    FAMILY HISTORY: No family history on file.  EXAM: BP 150/80 mmHg  Pulse 108  Temp(Src) 101.7 F (38.7 C) (Oral)  Resp 14  Ht 5\' 3"  (1.6 m)  Wt 232 lb (105.235 kg)  BMI 41.11 kg/m2  SpO2 100%  LMP 10/12/2014 CONSTITUTIONAL: Alert and oriented and responds appropriately to questions. Well-appearing; well-nourished HEAD: Normocephalic EYES: Conjunctivae clear, PERRL ENT: normal nose; no rhinorrhea; moist mucous membranes; pharynx without lesions noted NECK: Supple, no meningismus, no LAD  CARD: Regular and tachycardic; S1 and S2 appreciated; no murmurs, no clicks, no rubs, no gallops RESP: Normal chest excursion without splinting or tachypnea; breath sounds clear and equal bilaterally; no wheezes, no rhonchi, no rales,  ABD/GI: Normal bowel sounds; non-distended; soft, non-tender, no rebound, no guarding RECTAL:  Normal rectal tone, 1 small external nonthrombosed nonbleeding hemorrhoid, no gross blood or melena BACK:  The back appears normal and is non-tender to palpation, there is no CVA tenderness EXT: Patient's left anterior shin is erythematous and warm and extremely tender to palpation, no calf tenderness or swelling, compartments soft, 2+ DP pulses bilaterally, no joint effusions, Normal ROM in all joints; noise extremities are non-tender to palpation; no edema; normal capillary refill; no cyanosis    SKIN: Normal color for age and race; warm NEURO: Moves all extremities equally PSYCH: The patient's mood and manner are appropriate. Grooming and personal hygiene are appropriate.  MEDICAL DECISION MAKING: Patient here with  what appears to be left lower extremity cellulitis. No signs of DVT on exam. Neurovascular intact distally. She is febrile and tachycardic and meets SIRs arterial. We'll give vancomycin as well as Zosyn. Will give IV fluids. Will obtain labs, cultures, lactate.  ED PROGRESS: Patient's labs show that she has a hemoglobin of 6.4. She has a  history of heavy menstrual cycles. Last menstrual cycle was one week ago. No current vaginal bleeding. She states her cycles will last between 4-7 days and she will pass clots. Denies hematochezia or melena. Rectal exam is normal with no gross blood or melena. Discussed with patient that she will need a transfusion. Consented for blood.   2:14 AM  Pt's labs show leukocytosis. Given her tachycardia, fever, leukocytosis and anemia requiring transfusion, will admit. Urine shows no sign of infection. Urine pregnancy negative. Otherwise labs are unremarkable. Hemoccult negative. Will discuss with medicine for admission.   2:47 AM  Spoke with Dr. Arnoldo Morale with hospitalist service for admission to telemetry, inpatient. Patient does not have a primary care provider.     EKG Interpretation  Date/Time:  Thursday October 24 2014 01:46:20 EST Ventricular Rate:  114 PR Interval:  165 QRS Duration: 71 QT Interval:  335 QTC Calculation: 461 R Axis:   67 Text Interpretation:  Sinus tachycardia Low voltage, precordial leads Borderline T abnormalities, diffuse leads Confirmed by Shina Wass,  DO, Isabell Bonafede ([id number]) on 10/24/2014 2:14:33 AM       CRITICAL CARE Performed by: Nyra Jabs   Total critical care time: 45 minutes  Critical care time was exclusive of separately billable procedures and treating other patients.  Critical care was necessary to treat or prevent imminent or life-threatening deterioration.  Critical care was time spent personally by me on the following activities: development of treatment plan with patient and/or surrogate as well as nursing, discussions with consultants, evaluation of patient's response to treatment, examination of patient, obtaining history from patient or surrogate, ordering and performing treatments and interventions, ordering and review of laboratory studies, ordering and review of radiographic studies, pulse oximetry and re-evaluation of patient's  condition.      [place], DO 10/24/14 [phone]

## 2014-10-24 NOTE — Plan of Care (Signed)
Problem: Phase I Progression Outcomes Goal: OOB as tolerated unless otherwise ordered Outcome: Completed/Met Date Met:  10/24/14 Goal: Initial discharge plan identified Outcome: Completed/Met Date Met:  10/24/14

## 2014-10-24 NOTE — ED Notes (Signed)
IV team at bedside 

## 2014-10-24 NOTE — H&P (Signed)
[facility] Admission History and Physical       MARCINE GADWAY URK:[id number] DOB: 1976-07-25 DOA: 10/24/2014  Referring physician: EDP PCP: No PCP Per Patient  Specialists:   Chief Complaint: Fever, Chills, and Left Leg Redness  HPI: Jordan Pruitt is a 38 y.o. 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   She reports having fevers to 101.7 today.  In the ED,  She was also found to have tachycardia with a heart rate in the 110s.   She reports that she has had bouts of cellulitis in either leg since she had a injury to her leg as a child.   She was also found to have a hemoglobin level of 6.4, and an FOBT was done by the EDP and was Heme Negative.   She reports that she has pica for ice for the past several months, and she has not had any primary care follow-up in 2 years since she lost her insurance.   She had been seen years ago at the [facility].      Review of Systems:  Constitutional: No Weight Loss, No Weight Gain, Night Sweats, +Fevers, +Chills, Dizziness, Fatigue, or Generalized Weakness HEENT: No Headaches, Difficulty Swallowing,Tooth/Dental Problems,Sore Throat,  No Sneezing, Rhinitis, Ear Ache, Nasal Congestion, or Post Nasal Drip,  Cardio-vascular:  No Chest pain, Orthopnea, PND, Edema in Lower Extremities, Anasarca, Dizziness, Palpitations  Resp: No Dyspnea, No DOE, No Productive Cough, No Non-Productive Cough, No Hemoptysis, No Wheezing.    GI: No Heartburn, Indigestion, Abdominal Pain, Nausea, Vomiting, Diarrhea, Hematemesis, Hematochezia, Melena, Change in Bowel Habits,  Loss of Appetite  GU: No Dysuria, Change in Color of Urine, No Urgency or Frequency, No Flank pain.  Musculoskeletal: No Joint Pain or Swelling, No Decreased Range of Motion, No Back Pain.  Neurologic: No Syncope, No  Seizures, Muscle Weakness, Paresthesia, Vision Disturbance or Loss, No Diplopia, No Vertigo, No Difficulty Walking,  Skin: + Redness of LLE, Rash or Lesions. Psych: No Change in Mood or Affect, No Depression or Anxiety, No Memory loss, No Confusion, or Hallucinations   Past Medical History  Diagnosis Date  . Paronychia   . Menorrhagia   . Anemia      Past Surgical History  Procedure Laterality Date  . Tubal ligation    . Cesarean section      x4      Prior to Admission medications   Medication Sig Start Date End Date Taking? Authorizing Provider  acetaminophen (TYLENOL) 325 MG tablet Take 650 mg by mouth every 6 (six) hours as needed for mild pain.   Yes Historical Provider, MD  ferrous sulfate 325 (65 FE) MG tablet Take 325 mg by mouth daily with breakfast.   Yes Historical Provider, MD  Multiple Vitamins-Minerals (MULTIVITAMIN WITH MINERALS) tablet Take 1 tablet by mouth daily.   Yes Historical Provider, MD  benzonatate (TESSALON) 100 MG capsule Take 1 capsule (100 mg total) by mouth every 8 (eight) hours. Patient not taking: Reported on 10/24/2014 06/26/14   Montine Circle, PA-C  diphenhydramine-acetaminophen (TYLENOL PM) 25-500 MG TABS Take 2 tablets by mouth at bedtime as needed (sleep).    Historical Provider, MD     Allergies  Allergen Reactions  . Ibuprofen Itching    Itching w/ Rx 800mg ; able to take lower OTC  doses without any problems     Social History:  reports that she has been smoking Cigarettes.  She has been smoking about 0.50 packs per day. She does not have any smokeless tobacco history on file. She reports that she drinks alcohol. She reports that she does not use illicit drugs.     Family History  Problem Relation Age of Onset  . Hypertension Father   . Hypertension Mother   . Diabetes Mother   . Diabetes Maternal Aunt        Physical Exam:  GEN:  Pleasant Obese 38 y.o. African American female examined  and in no acute distress; cooperative  with exam Filed Vitals:   10/24/14 0014 10/24/14 0100 10/24/14 0151 10/24/14 0200  BP: 150/80 123/62 119/52 123/54  Pulse: 108 118 118 116  Temp: 101.7 F (38.7 C)  99.9 F (37.7 C)   TempSrc: Oral  Oral   Resp: 14  24 22   Height: 5\' 3"  (1.6 m)     Weight: 105.235 kg (232 lb)     SpO2: 100% 100% 100% 100%   Blood pressure 123/54, pulse 116, temperature 99.9 F (37.7 C), temperature source Oral, resp. rate 22, height 5\' 3"  (1.6 m), weight 105.235 kg (232 lb), last menstrual period 10/12/2014, SpO2 100 %. PSYCH: She is alert and oriented x4; does not appear anxious does not appear depressed; affect is normal HEENT: Normocephalic and Atraumatic, Mucous membranes pink; PERRLA; EOM intact; Fundi:  Benign;  No scleral icterus, Nares: Patent, Oropharynx: Clear,  Fair Dentition,    Neck:  FROM, No Cervical Lymphadenopathy nor Thyromegaly or Carotid Bruit; No JVD; Breasts:: Not examined CHEST WALL: No tenderness CHEST: Normal respiration, clear to auscultation bilaterally HEART: Regular rate and rhythm; no murmurs rubs or gallops BACK: No kyphosis or scoliosis; No CVA tenderness ABDOMEN: Positive Bowel Sounds, Obese, Soft Non-Tender; No Masses, No Organomegaly. Rectal Exam: Not done EXTREMITIES: No Cyanosis, Clubbing, or Edema; No Ulcerations. Genitalia: not examined PULSES: 2+ and symmetric SKIN: Normal hydration, +Erythema along the anterior Aspect of the Anterior Tibia of LLE,    no rash or ulceration CNS:  Alert and Oriented x 4, No Focal Deficits Vascular: pulses palpable throughout    Labs on Admission:  Basic Metabolic Panel:  Recent Labs Lab 10/24/14 0018  NA 133*  K 3.6*  CL 98  CO2 20  GLUCOSE 105*  BUN 7  CREATININE 0.69  CALCIUM 9.3   Liver Function Tests:  Recent Labs Lab 10/24/14 0018  AST 17  ALT 10  ALKPHOS 67  BILITOT 0.6  PROT 7.7  ALBUMIN 3.6   No results for input(s): LIPASE, AMYLASE in the last 168 hours. No results for input(s): AMMONIA in  the last 168 hours. CBC:  Recent Labs Lab 10/24/14 0018  WBC 16.6*  NEUTROABS 15.1*  HGB 6.4*  HCT 22.3*  MCV 61.3*  PLT 475*   Cardiac Enzymes: No results for input(s): CKTOTAL, CKMB, CKMBINDEX, TROPONINI in the last 168 hours.  BNP (last 3 results) No results for input(s): PROBNP in the last 8760 hours. CBG: No results for input(s): GLUCAP in the last 168 hours.  Radiological Exams on Admission: No results found.   EKG: Independently reviewed. Sinus Tachycardia Rate =114   Assessment/Plan:   38 y.o. female with  Principal Problem:   1.   Sepsis- Fever, Tachycardia, and LLE Cellulitis   IV Vancomycin and Zosyn   IVFs  Active Problems:   2.   Cellulitis of LLE  IV Vancomycin and Zosyn   IVFs     3.   Sinus tachycardia- due to Sepsis   IVFs       4.   Anemia- due to Menorrhagia   Send Anemia Panel   Transfuse 2 units Packed RBCs     5.   Leukocytosis- Due to #1, and #2   IV Abx   Monitor Trend     6.   DVT Prophylaxis   Lovenox    Code Status:    FULL CODE Family Communication:    Family at Bedside Disposition Plan:     Inpatient    Time spent: [facility] Pager [phone]   If [place] Please Contact the Day Rounding Team MD for [facility]  If 7PM-7AM, Please Contact Night-Floor Coverage  [email or web address] Password [email or web address] 10/24/2014, 3:40 AM

## 2014-10-24 NOTE — ED Notes (Signed)
Pt. reports left leg pain onset today with nausea and vomitting , febrile , no diarrhea , denies injury or fall.

## 2014-10-24 NOTE — Plan of Care (Signed)
Problem: Phase I Progression Outcomes Goal: Temperature < 102 Outcome: Completed/Met Date Met:  10/24/14 Goal: Wound assessment- dressing change as appropriate Outcome: Completed/Met Date Met:  10/24/14

## 2014-10-24 NOTE — Progress Notes (Signed)
Peripherally Inserted Central Catheter/Midline Placement  The IV Nurse has discussed with the patient and/or persons authorized to consent for the patient, the purpose of this procedure and the potential benefits and risks involved with this procedure.  The benefits include less needle sticks, lab draws from the catheter and patient may be discharged home with the catheter.  Risks include, but not limited to, infection, bleeding, blood clot (thrombus formation), and puncture of an artery; nerve damage and irregular heat beat.  Alternatives to this procedure were also discussed.  PICC/Midline Placement Documentation  PICC / Midline Double Lumen 93/81/82 PICC Right Basilic 40 cm 0 cm (Active)  Indication for Insertion or Continuance of Line Poor Vasculature-patient has had multiple peripheral attempts or PIVs lasting less than 24 hours 10/24/2014 11:00 AM  Exposed Catheter (cm) 0 cm 10/24/2014 11:00 AM  Dressing Change Due 10/31/14 10/24/2014 11:00 AM       Jule Economy Horton 10/24/2014, 11:47 AM

## 2014-10-24 NOTE — ED Notes (Signed)
MD at bedside. 

## 2014-10-24 NOTE — Progress Notes (Signed)
Utilization review completed.  

## 2014-10-24 NOTE — ED Notes (Signed)
PT returned from bathroom. Pt monitored by pulse ox, bp cuff, and 12-lead.

## 2014-10-25 LAB — BASIC METABOLIC PANEL
Anion gap: 12 (ref 5–15)
BUN: 5 mg/dL — ABNORMAL LOW (ref 6–23)
CALCIUM: 8.9 mg/dL (ref 8.4–10.5)
CO2: 21 mEq/L (ref 19–32)
CREATININE: 0.6 mg/dL (ref 0.50–1.10)
Chloride: 105 mEq/L (ref 96–112)
GFR calc non Af Amer: >90 mL/min (ref >90)
Glucose, Bld: 88 mg/dL (ref 70–99)
Potassium: 3.8 mEq/L (ref 3.7–5.3)
Sodium: 138 mEq/L (ref 137–147)

## 2014-10-25 LAB — URINE CULTURE

## 2014-10-25 LAB — CBC
HCT: 25.9 % — ABNORMAL LOW (ref 36.0–46.0)
Hemoglobin: 7.8 g/dL — ABNORMAL LOW (ref 12.0–15.0)
MCH: 20.2 pg — AB (ref 26.0–34.0)
MCHC: 30.1 g/dL (ref 30.0–36.0)
MCV: 66.9 fL — AB (ref 78.0–100.0)
PLATELETS: 375 10*3/uL (ref 150–400)
RBC: 3.87 MIL/uL (ref 3.87–5.11)
RDW: 24.5 % — AB (ref 11.5–15.5)
WBC: 9.2 10*3/uL (ref 4.0–10.5)

## 2014-10-25 MED ORDER — SODIUM CHLORIDE 0.9 % IV SOLN
500.0000 mg | Freq: Once | INTRAVENOUS | Status: AC
  Administered 2014-10-25: 500 mg via INTRAVENOUS
  Filled 2014-10-25: qty 10

## 2014-10-25 NOTE — Progress Notes (Signed)
[facility] PROGRESS NOTE  Jordan Pruitt ZHG:[id number] DOB: 05/17/1976 DOA: 10/24/2014 PCP: No PCP Per Patient  Assessment/Plan: 1. Sepsis from the left leg cellulitis: Improved. On cefazolin. Blood cultures negative so far.  Resume IV hydration.   2. Sinus tachycardia: resolved with improvement in sepsis.    3. Anemia: S/p 2 units of prbc transfusion and h&H improved. Transfuse to keep hemoglobin greater than 7. IV iron ordered.   Leukocytosis :  Resolved.    Code Status: full code.  Family Communication: family at bedside Disposition Plan: pending. Possibly tomorrow.    Consultants:  none  Procedures:  Venous duplex   Antibiotics:  Cefazolin 12/10  HPI/Subjective: Feeling much better than yesterday  Objective: Filed Vitals:   10/25/14 0344  BP: 122/57  Pulse: 82  Temp: 98.6 F (37 C)  Resp: 20    Intake/Output Summary (Last 24 hours) at 10/25/14 1101 Last data filed at 10/25/14 0750  Gross per 24 hour  Intake   1540 ml  Output    550 ml  Net    990 ml   Filed Weights   10/24/14 0014 10/24/14 0420  Weight: 105.235 kg (232 lb) 107.3 kg (236 lb 8.9 oz)    Exam:   General:  ALERT afebrile comfortable  Cardiovascular: s1s2  Respiratory: ctab, no wheezing or rhonchi  Abdomen: soft non tender non distended bowel sounds heard  Musculoskeletal: left lower extremity pain and tenderness improved.   Data Reviewed: Basic Metabolic Panel:  Recent Labs Lab 10/24/14 0018 10/24/14 0540 10/25/14 0505  NA 133* 135* 138  K 3.6* 3.6* 3.8  CL 98 102 105  CO2 20 20 21   GLUCOSE 105* 113* 88  BUN 7 5* 5*  CREATININE 0.69 0.69 0.60  CALCIUM 9.3 8.8 8.9   Liver Function Tests:  Recent Labs Lab 10/24/14 0018  AST 17  ALT 10  ALKPHOS 67  BILITOT 0.6  PROT 7.7  ALBUMIN 3.6   No results for input(s): LIPASE, AMYLASE in the last 168 hours. No results for input(s): AMMONIA in the last 168 hours. CBC:  Recent Labs Lab  10/24/14 0018 10/24/14 0540 10/24/14 2050 10/25/14 0505  WBC 16.6* 14.1*  --  9.2  NEUTROABS 15.1*  --   --   --   HGB 6.4* 5.7* 8.2* 7.8*  HCT 22.3* 20.4* 27.3* 25.9*  MCV 61.3* 60.4*  --  66.9*  PLT 475* 435*  --  375   Cardiac Enzymes: No results for input(s): CKTOTAL, CKMB, CKMBINDEX, TROPONINI in the last 168 hours. BNP (last 3 results) No results for input(s): PROBNP in the last 8760 hours. CBG:  Recent Labs Lab 10/24/14 0739  GLUCAP 139*    Recent Results (from the past 240 hour(s))  Culture, blood (routine x 2)     Status: None (Preliminary result)   Collection Time: 10/24/14  1:30 AM  Result Value Ref Range Status   Specimen Description BLOOD RIGHT ARM  Final   Special Requests BOTTLES DRAWN AEROBIC AND ANAEROBIC 10CC  Final   Culture  Setup Time   Final    10/24/2014 08:58 Performed at [facility]    Culture   Final           BLOOD CULTURE RECEIVED NO GROWTH TO DATE CULTURE WILL BE HELD FOR 5 DAYS BEFORE ISSUING A FINAL NEGATIVE REPORT Performed at [facility]    Report Status PENDING  Incomplete  Culture, blood (routine x 2)     Status: None (  Preliminary result)   Collection Time: 10/24/14  1:37 AM  Result Value Ref Range Status   Specimen Description BLOOD RIGHT HAND  Final   Special Requests BOTTLES DRAWN AEROBIC ONLY 10CC  Final   Culture  Setup Time   Final    10/24/2014 08:58 Performed at [facility]    Culture   Final           BLOOD CULTURE RECEIVED NO GROWTH TO DATE CULTURE WILL BE HELD FOR 5 DAYS BEFORE ISSUING A FINAL NEGATIVE REPORT Performed at [facility]    Report Status PENDING  Incomplete     Studies: No results found.  Scheduled Meds: . sodium chloride   Intravenous Once  .  ceFAZolin (ANCEF) IV  2 g Intravenous 3 times per day  . vitamin B-12  500 mcg Oral Daily  . enoxaparin (LOVENOX) injection  40 mg Subcutaneous Q24H  . ferrous sulfate  325 mg Oral Q breakfast  . furosemide  20 mg  Intravenous Once  . multivitamin with minerals  1 tablet Oral Daily  . sodium chloride  3 mL Intravenous Q12H   Continuous Infusions: . sodium chloride      Principal Problem:   Sepsis Active Problems:   Cellulitis   Anemia   Sinus tachycardia   Leukocytosis    Time spent: 15 MINUTES.     [facility] Pager [phone] If 7PM-7AM, please contact night-coverage at [email or web address], password [facility] 10/25/2014, 11:01 AM  LOS: 1 day

## 2014-10-26 LAB — CBC
HCT: 26.3 % — ABNORMAL LOW (ref 36.0–46.0)
Hemoglobin: 8 g/dL — ABNORMAL LOW (ref 12.0–15.0)
MCH: 20.5 pg — ABNORMAL LOW (ref 26.0–34.0)
MCHC: 30.4 g/dL (ref 30.0–36.0)
MCV: 67.3 fL — ABNORMAL LOW (ref 78.0–100.0)
Platelets: 387 10*3/uL (ref 150–400)
RBC: 3.91 MIL/uL (ref 3.87–5.11)
RDW: 24.9 % — AB (ref 11.5–15.5)
WBC: 5.4 10*3/uL (ref 4.0–10.5)

## 2014-10-26 MED ORDER — AMOXICILLIN-POT CLAVULANATE 875-125 MG PO TABS: 1.0000 | ORAL_TABLET | Freq: Two times a day (BID) | ORAL | Status: DC

## 2014-10-26 MED ORDER — CYANOCOBALAMIN 500 MCG PO TABS: 500.0000 ug | ORAL_TABLET | Freq: Every day | ORAL | Status: DC

## 2014-10-26 NOTE — Discharge Summary (Signed)
Physician Discharge Summary  Jordan Pruitt MVH:[id number] DOB: October 24, 1976 DOA: 10/24/2014  PCP: No PCP Per Patient  Admit date: 10/24/2014 Discharge date: 10/26/2014  Time spent: 30 minutes  Recommendations for Outpatient Follow-up:  1. Follow up with PCP in one week 2. Follow up with GYN for menorrhagia.   Discharge Diagnoses:  Principal Problem:   Sepsis Active Problems:   Cellulitis   Anemia   Sinus tachycardia   Leukocytosis   Discharge Condition: improved.   Diet recommendation: regular  Filed Weights   10/24/14 0014 10/24/14 0420  Weight: 105.235 kg (232 lb) 107.3 kg (236 lb 8.9 oz)    History of present illness:  Jordan Pruitt is a 38 y.o. 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 She was admitted to medical service for further evaluation.   Hospital Course:  1. Sepsis from the left leg cellulitis: Improved. On cefazolin. Blood cultures negative so far. she received 3 days of IV antibiotics and later than transitioned to oral antibiotics.   2. Sinus tachycardia: resolved with improvement in sepsis.    3. Anemia: S/p 2 units of prbc transfusion and h&H improved. Transfused to keep hemoglobin greater than 7. IV iron ordered. Recommend outpatient follow up with gyn for menorrhagia.   Leukocytosis :  Resolved.   Procedures:  Venous duplex  Consultations: none Discharge Exam: Filed Vitals:   10/26/14 0502  BP: 133/75  Pulse: 60  Temp: 98.8 F (37.1 C)  Resp: 20    General: alert afebrile comfortable Cardiovascular: s1s2 Respiratory: ctab  Discharge Instructions You were cared for by a hospitalist during your hospital stay. If you have any questions about your discharge medications or the care you received while you were in the hospital after you are discharged, you can call the unit and  asked to speak with the hospitalist on call if the hospitalist that took care of you is not available. Once you are discharged, your primary care physician will handle any further medical issues. Please note that NO REFILLS for any discharge medications will be authorized once you are discharged, as it is imperative that you return to your primary care physician (or establish a relationship with a primary care physician if you do not have one) for your aftercare needs so that they can reassess your need for medications and monitor your lab values.  Discharge Instructions    Discharge instructions    Complete by:  As directed   Follow up with PCP in one week. Please follow up at [facility] clinic as soon as possible and recheck CBC in one week.          Current Discharge Medication List    START taking these medications   Details  amoxicillin-clavulanate (AUGMENTIN) 875-125 MG per tablet Take 1 tablet by mouth 2 (two) times daily. Qty: 10 tablet, Refills: 0    cyanocobalamin 500 MCG tablet Take 1 tablet (500 mcg total) by mouth daily. Qty: 30 tablet, Refills: 1      CONTINUE these medications which have NOT CHANGED   Details  acetaminophen (TYLENOL) 325 MG tablet Take 650 mg by mouth every 6 (six) hours as needed for mild pain.    ferrous sulfate 325 (65 FE) MG tablet Take 325 mg by mouth daily with breakfast.    Multiple Vitamins-Minerals (MULTIVITAMIN WITH MINERALS) tablet  Take 1 tablet by mouth daily.    diphenhydramine-acetaminophen (TYLENOL PM) 25-500 MG TABS Take 2 tablets by mouth at bedtime as needed (sleep).      STOP taking these medications     benzonatate (TESSALON) 100 MG capsule        Allergies  Allergen Reactions  . Ibuprofen Itching    Itching w/ Rx 800mg ; able to take lower OTC doses without any problems   Follow-up Information    Follow up with [facility]    .   Contact information:   [address] [place]  [place] [id number] [phone]       The results of significant diagnostics from this hospitalization (including imaging, microbiology, ancillary and laboratory) are listed below for reference.    Significant Diagnostic Studies: No results found.  Microbiology: Recent Results (from the past 240 hour(s))  Urine culture     Status: None   Collection Time: 10/24/14 12:26 AM  Result Value Ref Range Status   Specimen Description URINE, RANDOM  Final   Special Requests NONE  Final   Culture  Setup Time   Final    10/24/2014 08:45 Performed at [facility]   Final    15,000 COLONIES/ML Performed at [facility]    Culture   Final    Multiple bacterial morphotypes present, none predominant. Suggest appropriate recollection if clinically indicated. Performed at [facility]    Report Status 10/25/2014 FINAL  Final  Culture, blood (routine x 2)     Status: None (Preliminary result)   Collection Time: 10/24/14  1:30 AM  Result Value Ref Range Status   Specimen Description BLOOD RIGHT ARM  Final   Special Requests BOTTLES DRAWN AEROBIC AND ANAEROBIC 10CC  Final   Culture  Setup Time   Final    10/24/2014 08:58 Performed at [facility]    Culture   Final           BLOOD CULTURE RECEIVED NO GROWTH TO DATE CULTURE WILL BE HELD FOR 5 DAYS BEFORE ISSUING A FINAL NEGATIVE REPORT Performed at [facility]    Report Status PENDING  Incomplete  Culture, blood (routine x 2)     Status: None (Preliminary result)   Collection Time: 10/24/14  1:37 AM  Result Value Ref Range Status   Specimen Description BLOOD RIGHT HAND  Final   Special Requests BOTTLES DRAWN AEROBIC ONLY 10CC  Final   Culture  Setup Time   Final    10/24/2014 08:58 Performed at [facility]    Culture   Final           BLOOD CULTURE RECEIVED NO GROWTH TO DATE CULTURE WILL BE HELD FOR 5 DAYS BEFORE ISSUING A FINAL NEGATIVE REPORT Performed at [facility]    Report Status PENDING  Incomplete     Labs: Basic Metabolic Panel:  Recent Labs Lab 10/24/14 0018 10/24/14 0540 10/25/14 0505  NA 133* 135* 138  K 3.6* 3.6* 3.8  CL 98 102 105  CO2 20 20 21   GLUCOSE 105* 113* 88  BUN 7 5* 5*  CREATININE 0.69 0.69 0.60  CALCIUM 9.3 8.8 8.9   Liver Function Tests:  Recent Labs Lab 10/24/14 0018  AST 17  ALT 10  ALKPHOS 67  BILITOT 0.6  PROT 7.7  ALBUMIN 3.6   No results for input(s): LIPASE, AMYLASE in the last 168 hours. No  results for input(s): AMMONIA in the last 168 hours. CBC:  Recent Labs Lab 10/24/14 0018 10/24/14 0540 10/24/14 2050 10/25/14 0505 10/26/14 0555  WBC 16.6* 14.1*  --  9.2 5.4  NEUTROABS 15.1*  --   --   --   --   HGB 6.4* 5.7* 8.2* 7.8* 8.0*  HCT 22.3* 20.4* 27.3* 25.9* 26.3*  MCV 61.3* 60.4*  --  66.9* 67.3*  PLT 475* 435*  --  375 387   Cardiac Enzymes: No results for input(s): CKTOTAL, CKMB, CKMBINDEX, TROPONINI in the last 168 hours. BNP: BNP (last 3 results) No results for input(s): PROBNP in the last 8760 hours. CBG:  Recent Labs Lab 10/24/14 0739  GLUCAP 139*       Signed:  Estil Vallee  [facility] 10/26/2014, 10:39 AM

## 2014-10-28 LAB — TYPE AND SCREEN
ABO/RH(D): O POS
ANTIBODY SCREEN: NEGATIVE
UNIT DIVISION: 0
UNIT DIVISION: 0
Unit division: 0
Unit division: 0

## 2014-10-30 LAB — CULTURE, BLOOD (ROUTINE X 2)
CULTURE: NO GROWTH
Culture: NO GROWTH

## 2015-04-07 ENCOUNTER — Emergency Department (INDEPENDENT_AMBULATORY_CARE_PROVIDER_SITE_OTHER)
Admission: EM | Admit: 2015-04-07 | Discharge: 2015-04-07 | Disposition: A | Discharge status: Home / Self Care | Payer: Self-pay | Source: Home / Self Care | Attending: Family Medicine | Admitting: Family Medicine

## 2015-04-07 ENCOUNTER — Encounter (HOSPITAL_COMMUNITY): Payer: Self-pay | Admitting: Emergency Medicine

## 2015-04-07 DIAGNOSIS — M545 Low back pain, unspecified: Secondary | ICD-10-CM

## 2015-04-07 DIAGNOSIS — L03116 Cellulitis of left lower limb: Secondary | ICD-10-CM

## 2015-04-07 MED ORDER — METHOCARBAMOL 500 MG PO TABS: 500.0000 mg | ORAL_TABLET | Freq: Four times a day (QID) | ORAL | Status: DC | PRN

## 2015-04-07 MED ORDER — DICLOFENAC SODIUM 75 MG PO TBEC: 75.0000 mg | DELAYED_RELEASE_TABLET | Freq: Two times a day (BID) | ORAL | Status: DC

## 2015-04-07 MED ORDER — CEPHALEXIN 500 MG PO CAPS: 500.0000 mg | ORAL_CAPSULE | Freq: Three times a day (TID) | ORAL | Status: DC

## 2015-04-07 MED ORDER — FLUCONAZOLE 150 MG PO TABS: 150.0000 mg | ORAL_TABLET | Freq: Every day | ORAL | Status: DC
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
## 2015-07-03 ENCOUNTER — Emergency Department (HOSPITAL_COMMUNITY): Payer: Medicaid Other

## 2015-07-03 ENCOUNTER — Encounter (HOSPITAL_COMMUNITY): Payer: Self-pay | Admitting: *Deleted

## 2015-07-03 ENCOUNTER — Emergency Department (HOSPITAL_COMMUNITY)
Admission: EM | Admit: 2015-07-03 | Discharge: 2015-07-04 | Disposition: A | Discharge status: Home / Self Care | Payer: Medicaid Other | Attending: Emergency Medicine | Admitting: Emergency Medicine

## 2015-07-03 DIAGNOSIS — M79606 Pain in leg, unspecified: Secondary | ICD-10-CM | POA: Diagnosis not present

## 2015-07-03 DIAGNOSIS — Z79899 Other long term (current) drug therapy: Secondary | ICD-10-CM | POA: Diagnosis not present

## 2015-07-03 DIAGNOSIS — Z3202 Encounter for pregnancy test, result negative: Secondary | ICD-10-CM | POA: Insufficient documentation

## 2015-07-03 DIAGNOSIS — Z720 Tobacco use: Secondary | ICD-10-CM | POA: Diagnosis not present

## 2015-07-03 DIAGNOSIS — K529 Noninfective gastroenteritis and colitis, unspecified: Secondary | ICD-10-CM

## 2015-07-03 DIAGNOSIS — R1084 Generalized abdominal pain: Secondary | ICD-10-CM

## 2015-07-03 DIAGNOSIS — D649 Anemia, unspecified: Secondary | ICD-10-CM | POA: Insufficient documentation

## 2015-07-03 DIAGNOSIS — R079 Chest pain, unspecified: Secondary | ICD-10-CM | POA: Diagnosis not present

## 2015-07-03 DIAGNOSIS — R112 Nausea with vomiting, unspecified: Secondary | ICD-10-CM | POA: Diagnosis present

## 2015-07-03 LAB — TROPONIN I: Troponin I: <0.03 ng/mL (ref <0.031)

## 2015-07-03 LAB — URINALYSIS, ROUTINE W REFLEX MICROSCOPIC
BILIRUBIN URINE: NEGATIVE
Glucose, UA: NEGATIVE mg/dL
Ketones, ur: NEGATIVE mg/dL
Leukocytes, UA: NEGATIVE
Nitrite: NEGATIVE
PH: 8 (ref 5.0–8.0)
Protein, ur: NEGATIVE mg/dL
SPECIFIC GRAVITY, URINE: 1.026 (ref 1.005–1.030)
UROBILINOGEN UA: 1 mg/dL (ref 0.0–1.0)

## 2015-07-03 LAB — URINE MICROSCOPIC-ADD ON

## 2015-07-03 LAB — BASIC METABOLIC PANEL
Anion gap: 9 (ref 5–15)
CHLORIDE: 106 mmol/L (ref 101–111)
CO2: 20 mmol/L — ABNORMAL LOW (ref 22–32)
CREATININE: 0.58 mg/dL (ref 0.44–1.00)
Calcium: 8.9 mg/dL (ref 8.9–10.3)
GFR calc Af Amer: >60 mL/min (ref >60)
GFR calc non Af Amer: >60 mL/min (ref >60)
Glucose, Bld: 108 mg/dL — ABNORMAL HIGH (ref 65–99)
Potassium: 3.4 mmol/L — ABNORMAL LOW (ref 3.5–5.1)
SODIUM: 135 mmol/L (ref 135–145)

## 2015-07-03 LAB — CBC
HCT: 27.3 % — ABNORMAL LOW (ref 36.0–46.0)
Hemoglobin: 8.6 g/dL — ABNORMAL LOW (ref 12.0–15.0)
MCH: 24 pg — AB (ref 26.0–34.0)
MCHC: 31.5 g/dL (ref 30.0–36.0)
MCV: 76.3 fL — ABNORMAL LOW (ref 78.0–100.0)
PLATELETS: 297 10*3/uL (ref 150–400)
RBC: 3.58 MIL/uL — ABNORMAL LOW (ref 3.87–5.11)
RDW: 17.4 % — AB (ref 11.5–15.5)
WBC: 13.4 10*3/uL — AB (ref 4.0–10.5)

## 2015-07-03 LAB — PREGNANCY, URINE: PREG TEST UR: NEGATIVE

## 2015-07-03 MED ORDER — SODIUM CHLORIDE 0.9 % IV SOLN: INTRAVENOUS | Status: DC

## 2015-07-03 MED ORDER — SODIUM CHLORIDE 0.9 % IV BOLUS (SEPSIS)
1000.0000 mL | Freq: Once | INTRAVENOUS | Status: AC
  Administered 2015-07-03: 1000 mL via INTRAVENOUS

## 2015-07-03 MED ORDER — ACETAMINOPHEN 325 MG PO TABS
650.0000 mg | ORAL_TABLET | Freq: Four times a day (QID) | ORAL | Status: DC | PRN
  Administered 2015-07-03: 650 mg via ORAL

## 2015-07-03 MED ORDER — IOHEXOL 300 MG/ML  SOLN
100.0000 mL | Freq: Once | INTRAMUSCULAR | Status: AC | PRN
  Administered 2015-07-03: 100 mL via INTRAVENOUS

## 2015-07-03 MED ORDER — ONDANSETRON HCL 4 MG/2ML IJ SOLN
4.0000 mg | Freq: Once | INTRAMUSCULAR | Status: AC
  Administered 2015-07-03: 4 mg via INTRAVENOUS
  Filled 2015-07-03: qty 2

## 2015-07-03 MED ORDER — ONDANSETRON 4 MG PO TBDP
ORAL_TABLET | ORAL | Status: AC
  Administered 2015-07-03: 4 mg via ORAL
  Filled 2015-07-03: qty 1

## 2015-07-03 MED ORDER — IOHEXOL 300 MG/ML  SOLN
25.0000 mL | Freq: Once | INTRAMUSCULAR | Status: AC | PRN
  Administered 2015-07-03: 25 mL via ORAL

## 2015-07-03 MED ORDER — ONDANSETRON 4 MG PO TBDP
4.0000 mg | ORAL_TABLET | Freq: Once | ORAL | Status: AC
  Administered 2015-07-03: 4 mg via ORAL

## 2015-07-03 MED ORDER — FENTANYL CITRATE (PF) 100 MCG/2ML IJ SOLN
50.0000 ug | Freq: Once | INTRAMUSCULAR | Status: AC
  Administered 2015-07-03: 50 ug via INTRAVENOUS
  Filled 2015-07-03: qty 2

## 2015-07-03 MED ORDER — ACETAMINOPHEN 325 MG PO TABS
ORAL_TABLET | ORAL | Status: AC
  Filled 2015-07-03: qty 2

## 2015-07-03 NOTE — ED Notes (Signed)
Patient presents stating she has not been feeling well for a couple of days, chest pain (states it may be from her dry heaving), nausea and vomiting, fever

## 2015-07-03 NOTE — ED Notes (Signed)
MD at bedside. 

## 2015-07-03 NOTE — ED Notes (Signed)
Pt back to room from CT

## 2015-07-03 NOTE — ED Notes (Addendum)
Pt reports nau/vom and chest pain that began this am. Pt reports fever as high as "104" at home. Also reports that she thinks she may have "celullitis" on her left leg, as she has had it in the past and had swelling and pain.

## 2015-07-04 MED ORDER — PROMETHAZINE HCL 25 MG PO TABS: 25.0000 mg | ORAL_TABLET | Freq: Four times a day (QID) | ORAL | Status: DC | PRN

## 2015-07-04 MED ORDER — LOPERAMIDE HCL 2 MG PO TABS: 2.0000 mg | ORAL_TABLET | Freq: Four times a day (QID) | ORAL | Status: DC | PRN

## 2015-07-04 MED ORDER — HYDROCODONE-ACETAMINOPHEN 5-325 MG PO TABS: 1.0000 | ORAL_TABLET | Freq: Four times a day (QID) | ORAL | Status: DC | PRN

## 2015-07-04 NOTE — ED Provider Notes (Addendum)
CSN: [id number]     Arrival date & time 07/03/15  2014 History   First MD Initiated Contact with Patient 07/03/15 2133     Chief Complaint  Patient presents with  . Chest Pain  . Leg Pain  . Nausea  . Emesis     (Consider location/radiation/quality/duration/timing/severity/associated sxs/prior Treatment) Patient is a 39 y.o. female presenting with chest pain, leg pain, and vomiting. The history is provided by the patient.  Chest Pain Associated symptoms: abdominal pain, fever, nausea and vomiting   Associated symptoms: no back pain, no headache and no shortness of breath   Leg Pain Associated symptoms: fever   Associated symptoms: no back pain   Emesis Associated symptoms: abdominal pain and diarrhea   Associated symptoms: no headaches    patient with 2 separate concerns. Both started yesterday. One is some discomfort to the right inner thigh where she thinks her some increased warmth and she may have a cellulitis there. The other has been some abdominal pain associated with fever nausea vomiting and some diarrhea. Patient states that the temperature at home was 100.4. Patient also states he should decrease urine some dysuria. Denies any pelvic discharge. No blood in the bowel movements or with vomiting. No other family members have been sick. Abdominal pain is somewhat generalized crampy mild in nature and rated as 4 out of 10.  Past Medical History  Diagnosis Date  . Paronychia   . Menorrhagia   . Anemia    Past Surgical History  Procedure Laterality Date  . Tubal ligation    . Cesarean section      x4   Family History  Problem Relation Age of Onset  . Hypertension Father   . Hypertension Mother   . Diabetes Mother   . Diabetes Maternal Aunt    Social History  Substance Use Topics  . Smoking status: Current Every Day Smoker -- 0.50 packs/day    Types: Cigarettes  . Smokeless tobacco: Never Used  . Alcohol Use: Yes   OB History    No data available     Review  of Systems  Constitutional: Positive for fever.  HENT: Negative for congestion.   Eyes: Negative for visual disturbance.  Respiratory: Negative for shortness of breath.   Cardiovascular: Positive for chest pain.  Gastrointestinal: Positive for nausea, vomiting, abdominal pain and diarrhea.  Genitourinary: Positive for dysuria, decreased urine volume and difficulty urinating.  Musculoskeletal: Negative for back pain.  Skin: Negative for rash.  Neurological: Negative for headaches.  Psychiatric/Behavioral: Negative for confusion.      Allergies  Ibuprofen  Home Medications   Prior to Admission medications   Medication Sig Start Date End Date Taking? Authorizing Provider  ferrous sulfate 325 (65 FE) MG tablet Take 325 mg by mouth daily with breakfast.   Yes Historical Provider, MD  Multiple Vitamin (MULTIVITAMIN WITH MINERALS) TABS tablet Take 1 tablet by mouth daily.   Yes Historical Provider, MD  HYDROcodone-acetaminophen (NORCO/VICODIN) 5-325 MG per tablet Take 1-2 tablets by mouth every 6 (six) hours as needed for moderate pain. 07/04/15   Fredia Sorrow, MD  loperamide (IMODIUM A-D) 2 MG tablet Take 1 tablet (2 mg total) by mouth 4 (four) times daily as needed for diarrhea or loose stools. 07/04/15   Fredia Sorrow, MD  promethazine (PHENERGAN) 25 MG tablet Take 1 tablet (25 mg total) by mouth every 6 (six) hours as needed for nausea or vomiting. 07/04/15   Fredia Sorrow, MD   BP 141/66 mmHg  Pulse 87  Temp(Src) 100.4 F (38 C) (Oral)  Resp 18  Ht 5\' 3"  (1.6 m)  Wt 256 lb 4.8 oz (116.257 kg)  BMI 45.41 kg/m2  SpO2 100%  LMP 06/28/2015 Physical Exam  Constitutional: She is oriented to person, place, and time. She appears well-developed and well-nourished. No distress.  HENT:  Head: Atraumatic.  Mouth/Throat: Oropharynx is clear and moist.  Eyes: Conjunctivae and EOM are normal. Pupils are equal, round, and reactive to light.  Neck: Normal range of motion. Neck supple.   Cardiovascular: Normal rate, regular rhythm and normal heart sounds.   No murmur heard. Pulmonary/Chest: Effort normal and breath sounds normal. No respiratory distress.  Abdominal: Soft. Bowel sounds are normal. She exhibits no distension. There is tenderness. There is no guarding.  Musculoskeletal: Normal range of motion. She exhibits tenderness. She exhibits no edema.  Right inner thigh with area of mild tenderness no erythema no increased warmth no palpable cord. No significant leg swelling distally.  Neurological: She is alert and oriented to person, place, and time. No cranial nerve deficit. She exhibits normal muscle tone. Coordination normal.  Skin: Skin is warm. No rash noted.  Nursing note and vitals reviewed.   ED Course  Procedures (including critical care time) Labs Review Labs Reviewed  BASIC METABOLIC PANEL - Abnormal; Notable for the following:    Potassium 3.4 (*)    CO2 20 (*)    Glucose, Bld 108 (*)    BUN <5 (*)    All other components within normal limits  CBC - Abnormal; Notable for the following:    WBC 13.4 (*)    RBC 3.58 (*)    Hemoglobin 8.6 (*)    HCT 27.3 (*)    MCV 76.3 (*)    MCH 24.0 (*)    RDW 17.4 (*)    All other components within normal limits  URINALYSIS, ROUTINE W REFLEX MICROSCOPIC (NOT AT [facility]) - Abnormal; Notable for the following:    APPearance CLOUDY (*)    Hgb urine dipstick MODERATE (*)    All other components within normal limits  URINE MICROSCOPIC-ADD ON - Abnormal; Notable for the following:    Squamous Epithelial / LPF MANY (*)    All other components within normal limits  TROPONIN I  PREGNANCY, URINE  POC URINE PREG, ED   Results for orders placed or performed during the hospital encounter of 76/19/50  Basic metabolic panel  Result Value Ref Range   Sodium 135 135 - 145 mmol/L   Potassium 3.4 (L) 3.5 - 5.1 mmol/L   Chloride 106 101 - 111 mmol/L   CO2 20 (L) 22 - 32 mmol/L   Glucose, Bld 108 (H) 65 - 99 mg/dL   BUN <5  (L) 6 - 20 mg/dL   Creatinine, Ser 0.58 0.44 - 1.00 mg/dL   Calcium 8.9 8.9 - 10.3 mg/dL   GFR calc non Af Amer >60 >60 mL/min   GFR calc Af Amer >60 >60 mL/min   Anion gap 9 5 - 15  CBC  Result Value Ref Range   WBC 13.4 (H) 4.0 - 10.5 K/uL   RBC 3.58 (L) 3.87 - 5.11 MIL/uL   Hemoglobin 8.6 (L) 12.0 - 15.0 g/dL   HCT 27.3 (L) 36.0 - 46.0 %   MCV 76.3 (L) 78.0 - 100.0 fL   MCH 24.0 (L) 26.0 - 34.0 pg   MCHC 31.5 30.0 - 36.0 g/dL   RDW 17.4 (H) 11.5 - 15.5 %  Platelets 297 150 - 400 K/uL  Troponin I  Result Value Ref Range   Troponin I <0.03 <0.031 ng/mL  Urinalysis, Routine w reflex microscopic (not at [facility])  Result Value Ref Range   Color, Urine YELLOW YELLOW   APPearance CLOUDY (A) CLEAR   Specific Gravity, Urine 1.026 1.005 - 1.030   pH 8.0 5.0 - 8.0   Glucose, UA NEGATIVE NEGATIVE mg/dL   Hgb urine dipstick MODERATE (A) NEGATIVE   Bilirubin Urine NEGATIVE NEGATIVE   Ketones, ur NEGATIVE NEGATIVE mg/dL   Protein, ur NEGATIVE NEGATIVE mg/dL   Urobilinogen, UA 1.0 0.0 - 1.0 mg/dL   Nitrite NEGATIVE NEGATIVE   Leukocytes, UA NEGATIVE NEGATIVE  Urine microscopic-add on  Result Value Ref Range   Squamous Epithelial / LPF MANY (A) RARE   WBC, UA 0-2 <3 WBC/hpf   RBC / HPF 3-6 <3 RBC/hpf   Bacteria, UA RARE RARE  Pregnancy, urine  Result Value Ref Range   Preg Test, Ur NEGATIVE NEGATIVE     Imaging Review Dg Chest 2 View  07/03/2015   CLINICAL DATA:  Fever with vomiting and difficulty breathing for 2 days  EXAM: CHEST  2 VIEW  COMPARISON:  June 26, 2014  FINDINGS: Lungs are clear. The heart size and pulmonary vascularity are normal. No adenopathy. There are rudimentary cervical ribs bilaterally. There is slight lower thoracic levoscoliosis.  IMPRESSION: No edema or consolidation.   Electronically Signed   By: Lowella Grip III M.D.   On: 07/03/2015 21:04   Ct Abdomen Pelvis W Contrast  07/04/2015   CLINICAL DATA:  Left lower quadrant abdominal pain  EXAM: CT  ABDOMEN AND PELVIS WITH CONTRAST  TECHNIQUE: Multidetector CT imaging of the abdomen and pelvis was performed using the standard protocol following bolus administration of intravenous contrast.  CONTRAST:  136mL OMNIPAQUE IOHEXOL 300 MG/ML  SOLN  COMPARISON:  None.  FINDINGS: BODY WALL: No contributory findings.  LOWER CHEST: No contributory findings.  ABDOMEN/PELVIS:  Liver: No focal abnormality.  Biliary: No evidence of biliary obstruction or stone.  Pancreas: Unremarkable.  Spleen: Unremarkable.  Adrenals: Unremarkable.  Kidneys and ureters: No hydronephrosis or stone.  Bladder: Unremarkable.  Reproductive: Bulbous appearance of uterus with heterogeneous enhancement suggesting fibroids, including a probable 25 mm intramural anterior body fibroid. No acute finding.  Bowel: No obstruction. No appendicitis.  Retroperitoneum: No mass or adenopathy.  Peritoneum: No ascites or pneumoperitoneum.  Vascular: No acute abnormality.  OSSEOUS: No acute abnormalities.  IMPRESSION: 1. No explanation for acute pain. 2. Probable uterine fibroids.   Electronically Signed   By: Monte Fantasia M.D.   On: 07/04/2015 00:18   I have personally reviewed and evaluated these images and lab results as part of my medical decision-making.   EKG Interpretation   Date/Time:  Thursday July 03 2015 20:18:16 EDT Ventricular Rate:  102 PR Interval:  154 QRS Duration: 78 QT Interval:  322 QTC Calculation: 419 R Axis:   57 Text Interpretation:  Sinus tachycardia Nonspecific T wave abnormality  Abnormal ECG Confirmed by Desmond Szabo  MD, Hadassah Rana ([id number]) on 07/04/2015  12:15:42 AM      MDM   Final diagnoses:  Generalized abdominal pain  Gastroenteritis    Patient with complaint of nausea vomiting and some diarrhea started yesterday. Associated with some dysuria and decreased urine output. Patient also a concern about discomfort to her right inner thigh. No evidence of any redness or swelling or increased warmth there. Patient's  other symptoms saying to be consistent  perhaps with a gastroenteritis. But had some abdominal pains underwent CT. Patient did have a temperature here of 100.4. No analysis sick at home.  Urinalysis is negative for urinary tract infection there is a mild leukocytosis. No significant electrolyte abnormalities. Patient describes some of the pain radiating up into her chest so a troponin was ordered prior to me seeing her and that was negative. Pregnancy test also negative. EKG other than some sinus tachycardia with a rate of 102 had no other significant findings.  CT of abdomen shows no acute abdominal process we'll treat with medications to take care of the nausea and the diarrhea.    Fredia Sorrow, MD 07/04/15 3220  Fredia Sorrow, MD 07/04/15 [phone]

## 2015-07-04 NOTE — Discharge Instructions (Signed)
Workup for the abdominal pain without any significant abnormalities. No evidence of any urinary tract infection no evidence of any significant problems inside the abdomen. This may very well be a viral process particularly with the fever. Take the Phenergan as needed for nausea. Take the Imodium as needed for diarrhea. Take the hydrocodone as needed for pain. Return for any new or worse symptoms.

## 2015-07-19 ENCOUNTER — Encounter (HOSPITAL_COMMUNITY): Payer: Self-pay | Admitting: Nurse Practitioner

## 2015-07-19 ENCOUNTER — Emergency Department (HOSPITAL_COMMUNITY)
Admission: EM | Admit: 2015-07-19 | Discharge: 2015-07-20 | Disposition: A | Discharge status: Home / Self Care | Payer: Medicaid Other | Attending: Emergency Medicine | Admitting: Emergency Medicine

## 2015-07-19 DIAGNOSIS — D649 Anemia, unspecified: Secondary | ICD-10-CM | POA: Insufficient documentation

## 2015-07-19 DIAGNOSIS — L03116 Cellulitis of left lower limb: Secondary | ICD-10-CM | POA: Diagnosis not present

## 2015-07-19 DIAGNOSIS — Z79899 Other long term (current) drug therapy: Secondary | ICD-10-CM | POA: Insufficient documentation

## 2015-07-19 DIAGNOSIS — Z8742 Personal history of other diseases of the female genital tract: Secondary | ICD-10-CM | POA: Insufficient documentation

## 2015-07-19 DIAGNOSIS — M79605 Pain in left leg: Secondary | ICD-10-CM | POA: Diagnosis present

## 2015-07-19 MED ORDER — SODIUM CHLORIDE 0.9 % IV BOLUS (SEPSIS)
1000.0000 mL | Freq: Once | INTRAVENOUS | Status: AC
  Administered 2015-07-19: 1000 mL via INTRAVENOUS

## 2015-07-19 MED ORDER — CLINDAMYCIN PHOSPHATE 900 MG/50ML IV SOLN
900.0000 mg | Freq: Once | INTRAVENOUS | Status: AC
  Administered 2015-07-19: 900 mg via INTRAVENOUS
  Filled 2015-07-19: qty 50

## 2015-07-19 NOTE — ED Notes (Signed)
She c/o L leg pain, tenderness, swelling , warmth and redness from thigh down to ankle increasingly worse since last week. This reminds her of when she had cellulitis.

## 2015-07-19 NOTE — ED Provider Notes (Signed)
CSN: [id number]     Arrival date & time 07/19/15  1836 History   First MD Initiated Contact with Patient 07/19/15 2016     Chief Complaint  Patient presents with  . Leg Pain   Patient is a 39 y.o. female presenting with general illness. The history is provided by the patient. No language interpreter was used.  Illness Location:  Left leg Quality:  Redness, swelling, pain Severity:  Moderate Onset quality:  Gradual Timing:  Constant Progression:  Worsening Chronicity:  Recurrent Context:  Redness, swelling, and pain of her left leg. Patient states symptoms started over the past 4-5 days and have progressively worsened. Patient versus chills at home but denies fevers. Patient has continued heat and drink well and denies nausea, vomiting, diarrhea, dysuria, abdominal pain, chest pain, shortness of breath, or cough. Patient has experienced cellulitis of her lower extremities previously and has responded well to outpatient antibiotics. Associated symptoms: rash   Associated symptoms: no abdominal pain, no chest pain, no congestion, no cough, no diarrhea, no fever, no nausea, no shortness of breath and no vomiting     Past Medical History  Diagnosis Date  . Paronychia   . Menorrhagia   . Anemia    Past Surgical History  Procedure Laterality Date  . Tubal ligation    . Cesarean section      x4   Family History  Problem Relation Age of Onset  . Hypertension Father   . Hypertension Mother   . Diabetes Mother   . Diabetes Maternal Aunt    Social History  Substance Use Topics  . Smoking status: Current Every Day Smoker -- 0.50 packs/day    Types: Cigarettes  . Smokeless tobacco: Never Used  . Alcohol Use: Yes   OB History    No data available      Review of Systems  Constitutional: Positive for chills. Negative for fever.  HENT: Negative for congestion.   Respiratory: Negative for cough and shortness of breath.   Cardiovascular: Negative for chest pain, palpitations and leg  swelling.  Gastrointestinal: Negative for nausea, vomiting, abdominal pain, diarrhea, constipation and abdominal distention.  Genitourinary: Negative for dysuria, urgency, frequency and hematuria.  Skin: Positive for rash.  All other systems reviewed and are negative.   Allergies  Ibuprofen  Home Medications   Prior to Admission medications   Medication Sig Start Date End Date Taking? Authorizing Provider  cyanocobalamin 500 MCG tablet Take 500 mcg by mouth daily.   Yes Historical Provider, MD  ferrous sulfate 325 (65 FE) MG tablet Take 650 mg by mouth daily with breakfast.    Yes Historical Provider, MD  HYDROcodone-acetaminophen (NORCO/VICODIN) 5-325 MG per tablet Take 1-2 tablets by mouth every 6 (six) hours as needed for moderate pain. 07/04/15  Yes Fredia Sorrow, MD  ibuprofen (ADVIL,MOTRIN) 200 MG tablet Take 800 mg by mouth every 6 (six) hours as needed for moderate pain.   Yes Historical Provider, MD  loperamide (IMODIUM A-D) 2 MG tablet Take 1 tablet (2 mg total) by mouth 4 (four) times daily as needed for diarrhea or loose stools. 07/04/15  Yes Fredia Sorrow, MD  Multiple Vitamin (MULTIVITAMIN WITH MINERALS) TABS tablet Take 1 tablet by mouth daily.   Yes Historical Provider, MD  promethazine (PHENERGAN) 25 MG tablet Take 1 tablet (25 mg total) by mouth every 6 (six) hours as needed for nausea or vomiting. 07/04/15  Yes Fredia Sorrow, MD  clindamycin (CLEOCIN) 150 MG capsule Take 3 capsules (450 mg  total) by mouth 3 (three) times daily. 07/20/15 07/27/15  Mayer Camel, MD   BP 103/50 mmHg  Pulse 87  Temp(Src) 99 F (37.2 C) (Oral)  Resp 16  Ht 5\' 3"  (1.6 m)  Wt 252 lb 9 oz (114.562 kg)  BMI 44.75 kg/m2  SpO2 100%  LMP 06/28/2015   Physical Exam  Constitutional: She is oriented to person, place, and time. She appears well-developed and well-nourished.  HENT:  Head: Normocephalic and atraumatic.  Eyes: Conjunctivae are normal. Pupils are equal, round, and reactive to  light.  Neck: Normal range of motion. Neck supple.  Cardiovascular: Regular rhythm and intact distal pulses.   Pulmonary/Chest: Effort normal and breath sounds normal.  Abdominal: Soft. Bowel sounds are normal.  Musculoskeletal: Normal range of motion.  Neurological: She is alert and oriented to person, place, and time.  Skin:  Left lower extremity warm to touch, overlying erythema of calf and shin. No evidence of induration, fluctuance, or crepitus. Pulses intact bilaterally.  Nursing note and vitals reviewed.   ED Course  Procedures   Labs Review Labs Reviewed - No data to display  Imaging Review No results found. I have personally reviewed and evaluated these images and lab results as part of my medical decision-making.   EKG Interpretation None      MDM  Ms. Lieurance is a 39 year old female presenting with redness, swelling, and pain of her left leg. Patient states symptoms started over the past 4-5 days and have progressively worsened. Patient versus chills at home but denies fevers. Patient has continued heat and drink well and denies nausea, vomiting, diarrhea, dysuria, abdominal pain, chest pain, shortness of breath, or cough. Patient has experienced cellulitis of her lower extremities previously and has responded well to outpatient antibiotics.  Exam above notable for young female lying in stretcher in no acute distress. Temp 99.23F. HR low 100s but responded well to IV fluids. Normotensive. Left lower extremity warm to touch, overlying erythema of calf and shin. No evidence of induration, fluctuance, or crepitus. Pulses intact bilaterally.  Patient given IV fluids in the emergency department with improvement of heart rate from the 100s to 70s. One dose of IV clindamycin given in the ED. Patient discharged home in stable condition with prescription for by mouth clindamycin for outpatient treatment of cellulitis. Patient will follow up with a primary care physician within the next  4-5 days to ensure that her saline assist improving. Strict ED precautions discussed. Patient understands and agrees to plan and has no further questions or concerns this time.  Patient care discussed with and followed by my attending, Dr. Serita Grit.   Final diagnoses:  Cellulitis of left lower extremity    Mayer Camel, MD 07/20/15 2440  Serita Grit, MD 07/20/15 2253

## 2015-07-20 MED ORDER — CLINDAMYCIN HCL 150 MG PO CAPS: 450.0000 mg | ORAL_CAPSULE | Freq: Three times a day (TID) | ORAL | Status: AC

## 2015-07-20 NOTE — ED Notes (Signed)
Pt. Left with all belongings. Discharge instructions were reviewed and all questions were answered.  

## 2016-04-07 ENCOUNTER — Emergency Department (HOSPITAL_COMMUNITY): Payer: Medicaid Other

## 2016-04-07 ENCOUNTER — Emergency Department (HOSPITAL_COMMUNITY)
Admission: EM | Admit: 2016-04-07 | Discharge: 2016-04-08 | Disposition: A | Discharge status: Home / Self Care | Payer: Medicaid Other | Attending: Emergency Medicine | Admitting: Emergency Medicine

## 2016-04-07 ENCOUNTER — Encounter (HOSPITAL_COMMUNITY): Payer: Self-pay | Admitting: Emergency Medicine

## 2016-04-07 DIAGNOSIS — Z862 Personal history of diseases of the blood and blood-forming organs and certain disorders involving the immune mechanism: Secondary | ICD-10-CM | POA: Insufficient documentation

## 2016-04-07 DIAGNOSIS — M79604 Pain in right leg: Secondary | ICD-10-CM | POA: Insufficient documentation

## 2016-04-07 DIAGNOSIS — R42 Dizziness and giddiness: Secondary | ICD-10-CM

## 2016-04-07 DIAGNOSIS — Z8742 Personal history of other diseases of the female genital tract: Secondary | ICD-10-CM | POA: Insufficient documentation

## 2016-04-07 DIAGNOSIS — Z872 Personal history of diseases of the skin and subcutaneous tissue: Secondary | ICD-10-CM | POA: Insufficient documentation

## 2016-04-07 DIAGNOSIS — Z3202 Encounter for pregnancy test, result negative: Secondary | ICD-10-CM | POA: Insufficient documentation

## 2016-04-07 DIAGNOSIS — E669 Obesity, unspecified: Secondary | ICD-10-CM | POA: Insufficient documentation

## 2016-04-07 DIAGNOSIS — R600 Localized edema: Secondary | ICD-10-CM | POA: Insufficient documentation

## 2016-04-07 DIAGNOSIS — R5383 Other fatigue: Secondary | ICD-10-CM | POA: Insufficient documentation

## 2016-04-07 DIAGNOSIS — F17210 Nicotine dependence, cigarettes, uncomplicated: Secondary | ICD-10-CM | POA: Insufficient documentation

## 2016-04-07 HISTORY — DX: Obesity, unspecified: E66.9

## 2016-04-07 LAB — COMPREHENSIVE METABOLIC PANEL
ALBUMIN: 3.5 g/dL (ref 3.5–5.0)
ALK PHOS: 71 U/L (ref 38–126)
ALT: 13 U/L — ABNORMAL LOW (ref 14–54)
ANION GAP: 8 (ref 5–15)
AST: 21 U/L (ref 15–41)
BILIRUBIN TOTAL: 0.3 mg/dL (ref 0.3–1.2)
BUN: <5 mg/dL — ABNORMAL LOW (ref 6–20)
CALCIUM: 8.9 mg/dL (ref 8.9–10.3)
CO2: 20 mmol/L — ABNORMAL LOW (ref 22–32)
Chloride: 107 mmol/L (ref 101–111)
Creatinine, Ser: 0.73 mg/dL (ref 0.44–1.00)
GLUCOSE: 120 mg/dL — AB (ref 65–99)
POTASSIUM: 3.3 mmol/L — AB (ref 3.5–5.1)
Sodium: 135 mmol/L (ref 135–145)
TOTAL PROTEIN: 7.8 g/dL (ref 6.5–8.1)

## 2016-04-07 LAB — URINALYSIS, ROUTINE W REFLEX MICROSCOPIC
BILIRUBIN URINE: NEGATIVE
Glucose, UA: NEGATIVE mg/dL
Ketones, ur: NEGATIVE mg/dL
LEUKOCYTES UA: NEGATIVE
NITRITE: NEGATIVE
PH: 5.5 (ref 5.0–8.0)
Protein, ur: NEGATIVE mg/dL
SPECIFIC GRAVITY, URINE: 1.025 (ref 1.005–1.030)

## 2016-04-07 LAB — CBC
HEMATOCRIT: 27.4 % — AB (ref 36.0–46.0)
HEMOGLOBIN: 7.5 g/dL — AB (ref 12.0–15.0)
MCH: 17.2 pg — ABNORMAL LOW (ref 26.0–34.0)
MCHC: 27.4 g/dL — AB (ref 30.0–36.0)
MCV: 63 fL — ABNORMAL LOW (ref 78.0–100.0)
Platelets: 270 10*3/uL (ref 150–400)
RBC: 4.35 MIL/uL (ref 3.87–5.11)
RDW: 21.5 % — ABNORMAL HIGH (ref 11.5–15.5)
WBC: 4.6 10*3/uL (ref 4.0–10.5)

## 2016-04-07 LAB — URINE MICROSCOPIC-ADD ON: WBC UA: NONE SEEN WBC/hpf (ref 0–5)

## 2016-04-07 LAB — BRAIN NATRIURETIC PEPTIDE: B Natriuretic Peptide: 37 pg/mL (ref 0.0–100.0)

## 2016-04-07 LAB — POC URINE PREG, ED: PREG TEST UR: NEGATIVE

## 2016-04-07 MED ORDER — MECLIZINE HCL 25 MG PO TABS
25.0000 mg | ORAL_TABLET | Freq: Once | ORAL | Status: AC
  Administered 2016-04-07: 25 mg via ORAL
  Filled 2016-04-07: qty 1

## 2016-04-07 NOTE — ED Provider Notes (Signed)
CSN: [id number]     Arrival date & time 04/07/16  2024 History   First MD Initiated Contact with Patient 04/07/16 2206     Chief Complaint  Patient presents with  . Fatigue  . Dizziness  . Leg Pain     (Consider location/radiation/quality/duration/timing/severity/associated sxs/prior Treatment) HPI   @ 10:28 pm - Went to see patient but GREG CAPPA is not in her room, RN does not know where she is at this time. @ 10:44 pm- Patient currently being wheeled into exam room.   Jinny Blossom y.o.female presents with a hx of paronychia, menorrhagia, anemia, and obesity presents to the Emergency Department complaining of gradual, intermittent unchanged lightheadedness, dizziness, fatigue for the past week, and right leg pain onset today- associated with a knot on the back of her red leg and "fever" in the leg. The patient rechecks her leg while we are talking and says that all of the symptoms are resolved. It no longer feels hot, hurts and the knot is no longer present. Associated symptoms include nausea and she is on day one her cycle which typically is very heavy. She tried taking a Goodie Powder for her symptoms which helped.  The last time she had these symptoms she had a hemoglobin of 5.5, she says that between 7-8 is normal for her due to heavy menstrual cycles.    Past Medical History  Diagnosis Date  . Paronychia   . Menorrhagia   . Anemia   . Obesity    Past Surgical History  Procedure Laterality Date  . Tubal ligation    . Cesarean section      x4   Family History  Problem Relation Age of Onset  . Hypertension Father   . Hypertension Mother   . Diabetes Mother   . Diabetes Maternal Aunt    Social History  Substance Use Topics  . Smoking status: Current Every Day Smoker -- 0.50 packs/day    Types: Cigarettes  . Smokeless tobacco: Never Used  . Alcohol Use: Yes   OB History    No data available     Review of Systems  Review of Systems All other  systems negative except as documented in the HPI. All pertinent positives and negatives as reviewed in the HPI.   Allergies  Ibuprofen  Home Medications   Prior to Admission medications   Medication Sig Start Date End Date Taking? Authorizing Provider  ibuprofen (ADVIL,MOTRIN) 200 MG tablet Take 800 mg by mouth every 6 (six) hours as needed for moderate pain.   Yes Historical Provider, MD  meclizine (ANTIVERT) 25 MG tablet Take 1 tablet (25 mg total) by mouth 3 (three) times daily as needed for dizziness. 04/08/16   Ilina Xu Carlota Raspberry, PA-C   BP 144/83 mmHg  Pulse 78  Temp(Src) 98.6 F (37 C) (Oral)  Resp 22  Ht 5\' 3"  (1.6 m)  Wt 116.574 kg  BMI 45.54 kg/m2  SpO2 100%  LMP 04/06/2016 Physical Exam  Constitutional: She is oriented to person, place, and time. She appears well-developed and well-nourished. No distress.  HENT:  Head: Normocephalic and atraumatic.  Right Ear: Tympanic membrane and ear canal normal.  Left Ear: Tympanic membrane and ear canal normal.  Nose: Nose normal.  Mouth/Throat: Uvula is midline, oropharynx is clear and moist and mucous membranes are normal.  Eyes: Pupils are equal, round, and reactive to light.  Neck: Normal range of motion. Neck supple.  Cardiovascular: Normal rate and regular rhythm.   Pulmonary/Chest:  Effort normal and breath sounds normal. No accessory muscle usage. No respiratory distress. She has no decreased breath sounds. She has no wheezes.  Abdominal: Soft.  No signs of abdominal distention  Musculoskeletal:  Bilateral 2+ pitting edema. Physiologic pulses Strengths intact No cellulitis, tenderness or asymmetrical swelling noted to left or right leg.  Neurological: She is alert and oriented to person, place, and time. She has normal strength. No cranial nerve deficit or sensory deficit. GCS eye subscore is 4. GCS verbal subscore is 5. GCS motor subscore is 6.  Acting at baseline  Skin: Skin is warm and dry. No rash noted.  Nursing  note and vitals reviewed.   ED Course  Procedures (including critical care time) Labs Review Labs Reviewed  COMPREHENSIVE METABOLIC PANEL - Abnormal; Notable for the following:    Potassium 3.3 (*)    CO2 20 (*)    Glucose, Bld 120 (*)    BUN <5 (*)    ALT 13 (*)    All other components within normal limits  CBC - Abnormal; Notable for the following:    Hemoglobin 7.5 (*)    HCT 27.4 (*)    MCV 63.0 (*)    MCH 17.2 (*)    MCHC 27.4 (*)    RDW 21.5 (*)    All other components within normal limits  URINALYSIS, ROUTINE W REFLEX MICROSCOPIC (NOT AT [facility]) - Abnormal; Notable for the following:    APPearance CLOUDY (*)    Hgb urine dipstick LARGE (*)    All other components within normal limits  URINE MICROSCOPIC-ADD ON - Abnormal; Notable for the following:    Squamous Epithelial / LPF 6-30 (*)    Bacteria, UA RARE (*)    All other components within normal limits  BRAIN NATRIURETIC PEPTIDE  POC URINE PREG, ED    Imaging Review Dg Chest 2 View  04/08/2016  CLINICAL DATA:  Acute onset of generalized chest pain, decreased hemoglobin and right lower extremity edema and pain. Lightheadedness, dizziness, fatigue and nausea. Initial encounter. EXAM: CHEST  2 VIEW COMPARISON:  Chest radiograph performed 07/03/2015 FINDINGS: The lungs are well-aerated and clear. There is no evidence of focal opacification, pleural effusion or pneumothorax. The heart is borderline enlarged. No acute osseous abnormalities are seen. IMPRESSION: Borderline cardiomegaly.  Lungs remain grossly clear. Electronically Signed   By: Garald Balding M.D.   On: 04/08/2016 00:09   I have personally reviewed and evaluated these images and lab results as part of my medical decision-making.   EKG Interpretation None      MDM   Final diagnoses:  Dizziness    The patient's symptoms resolved with oral meclizine. She states that she is no longer dizzy or lightheaded. Symptoms in her legs had resolved prior to my  examination. She no longer has any pain, warmth or a knot noted to the back or leg. She has no asymmetrical swelling. She has no shortness of breath or chest pain.  Her urinalysis was negative for infection, her CMP shows no acute findings her CBC shows hemoglobin 7.5 close to her baseline. Chest x-ray shows lungs to be grossly clear with a normal BNP of 37. Patient feels like she is ready to go home and follow-up with [facility] doctor since possible. We have discussed signs and symptoms that would warrant prompt return to the emergency department she is worse or understanding.  Filed Vitals:   04/07/16 2029 04/07/16 2315  BP: 134/79 144/83  Pulse: 96  78  Temp: 98.6 F (37 C)   Resp: [address] PA-C 04/08/16 0105  Sharlett Iles, MD 04/08/16 [phone]

## 2016-04-07 NOTE — ED Notes (Addendum)
Pt. reports lightheaded , dizziness , fatigue, nausea and right leg pain onset today , denies injury /ambulatory . No fever or chills.

## 2016-04-08 MED ORDER — MECLIZINE HCL 25 MG PO TABS: 25.0000 mg | ORAL_TABLET | Freq: Three times a day (TID) | ORAL | Status: DC | PRN

## 2016-04-08 NOTE — ED Notes (Signed)
Pt departed in NAD.  

## 2016-04-08 NOTE — Discharge Instructions (Signed)

## 2016-09-28 IMAGING — CR DG CHEST 2V
2 series · 2 of 2 positions shown · non-contrast
Comparison: June 26, 2014

CLINICAL DATA: Fever with vomiting and difficulty breathing for 2
days

EXAM:
CHEST  2 VIEW

[chest pa]
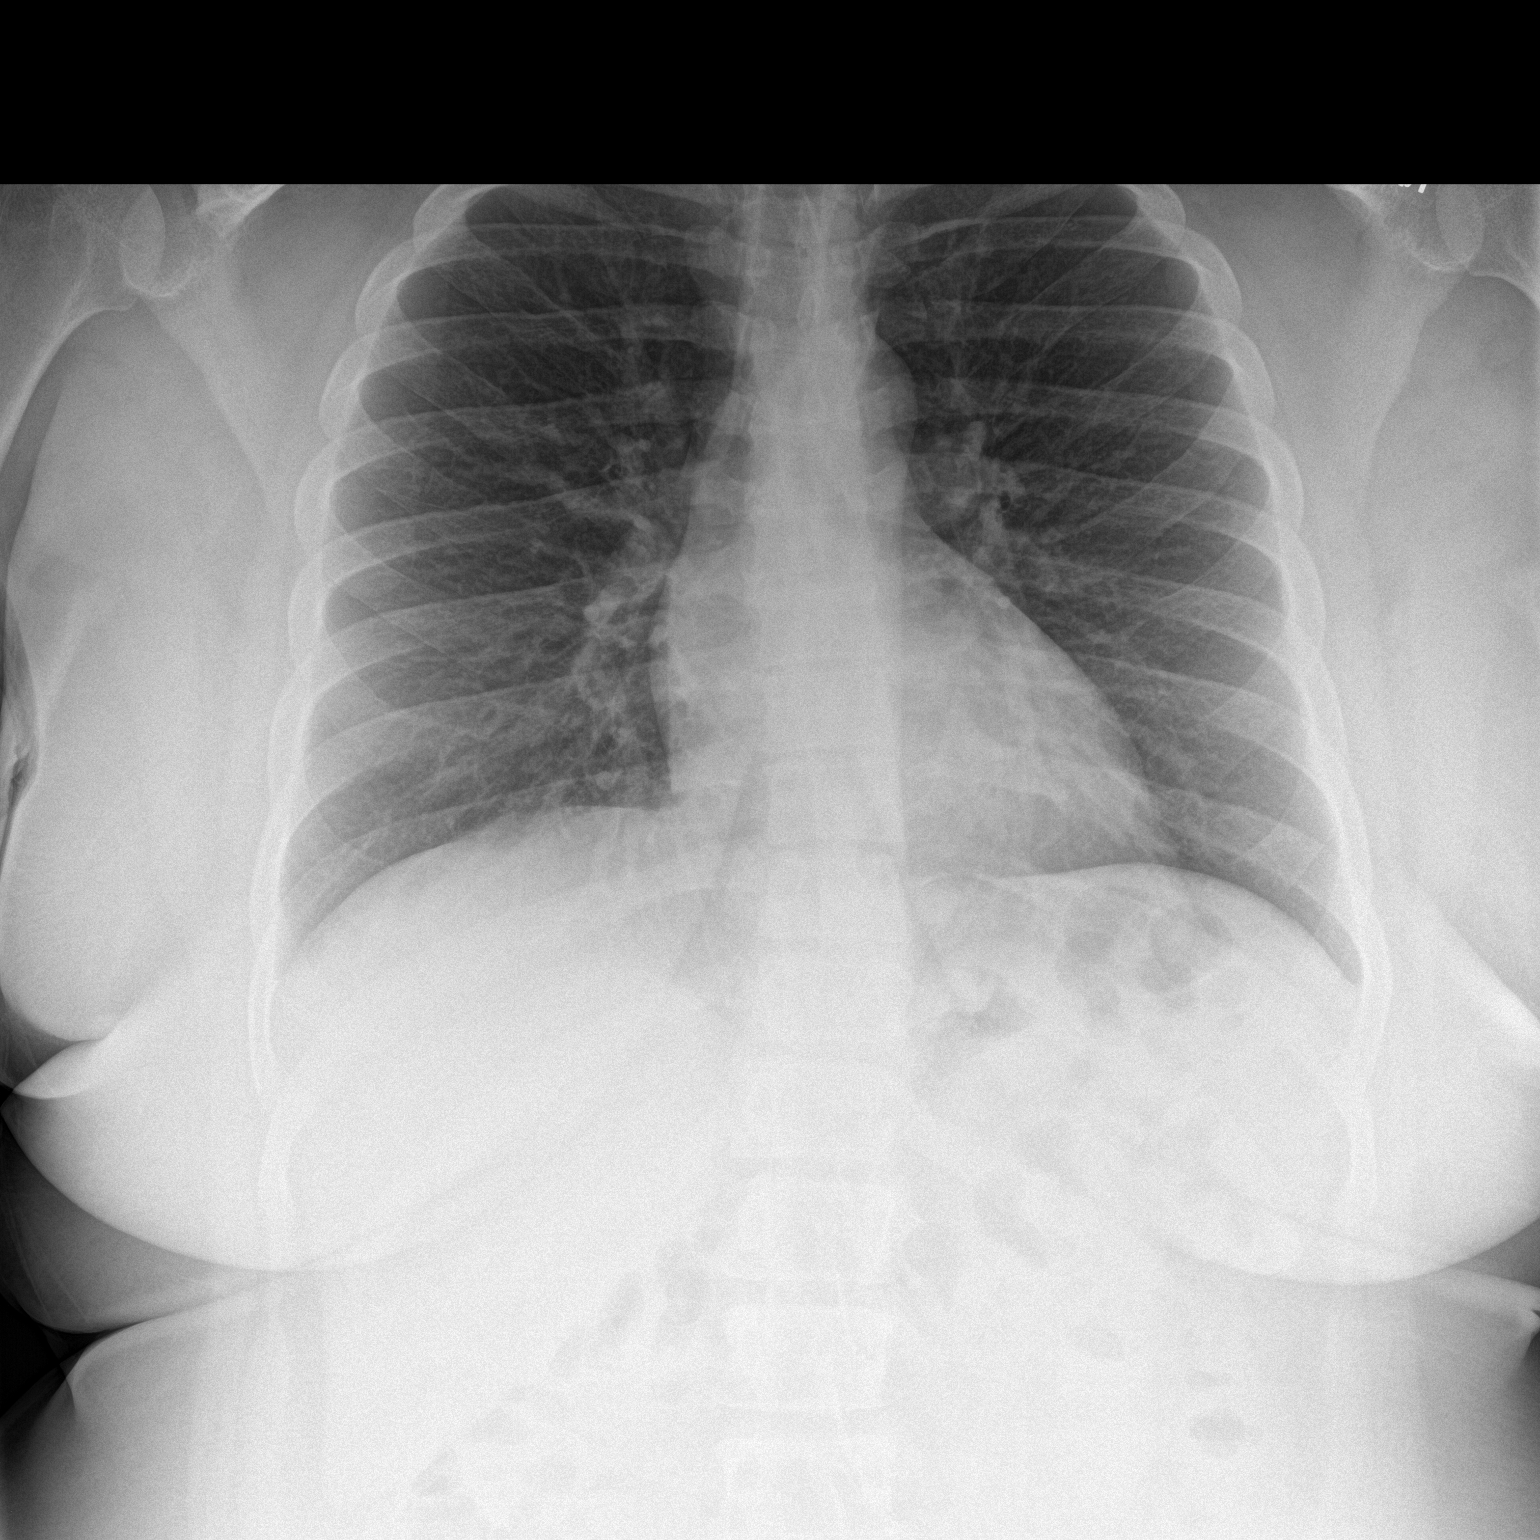

[chest lat]
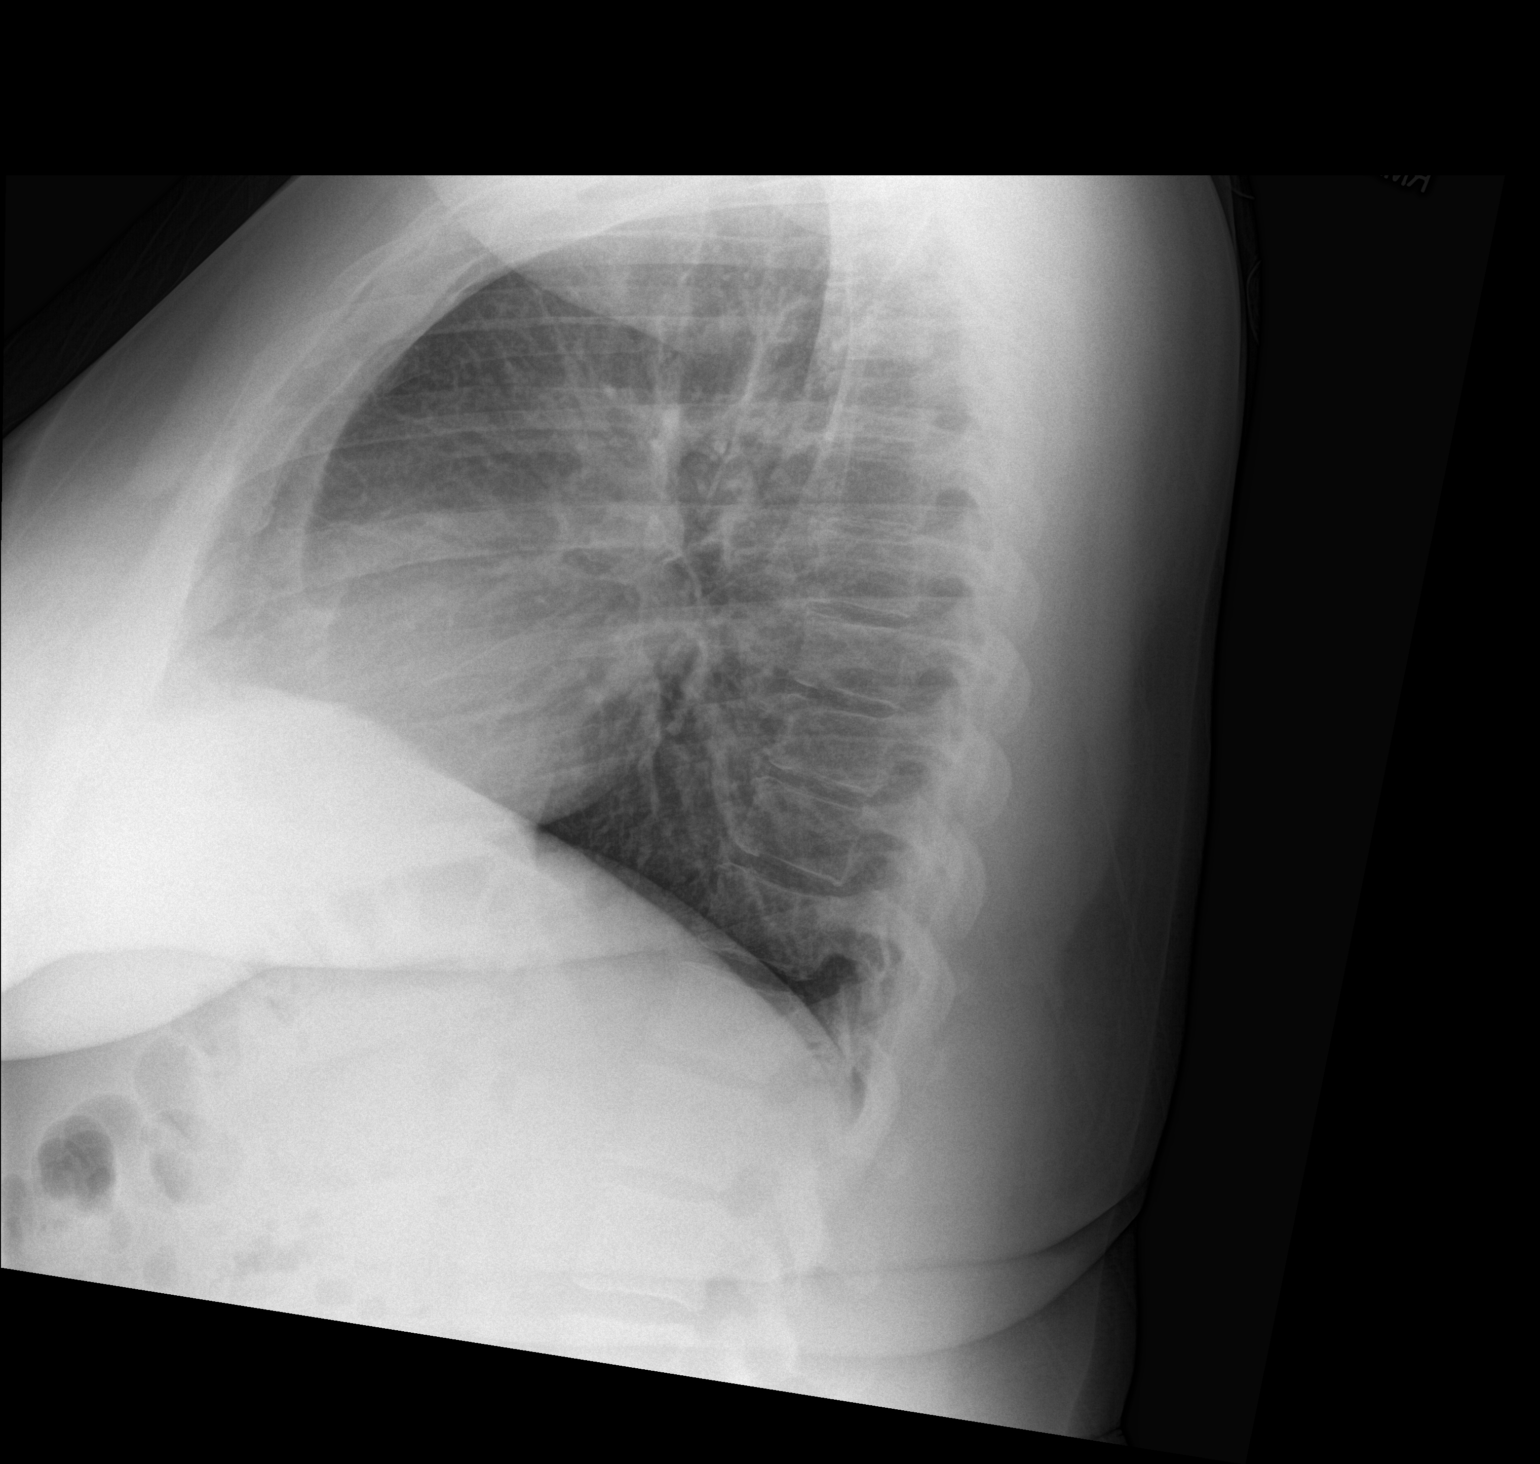

[2 of 2 positions shown; findings below may reference images not displayed]

FINDINGS: Lungs are clear. The heart size and pulmonary vascularity are
normal. No adenopathy. There are rudimentary cervical ribs
bilaterally. There is slight lower thoracic levoscoliosis.
IMPRESSION: No edema or consolidation.

## 2016-09-28 IMAGING — CT CT ABD-PELV W/ CM
2 of 4 series · 17 of 46 positions shown, 19 images · IV contrast (APPLIED)
Comparison: None.

CLINICAL DATA: Left lower quadrant abdominal pain

EXAM:
CT ABDOMEN AND PELVIS WITH CONTRAST
TECHNIQUE: Multidetector CT imaging of the abdomen and pelvis was performed
using the standard protocol following bolus administration of
intravenous contrast.
CONTRAST:  100mL OMNIPAQUE IOHEXOL 300 MG/ML  SOLN

[Series 2: abd/ pelvis 5.0 i30f 1 · axial · 0.76mm/px · z∈[+903,+1343]mm · 14 of 98 slices shown, 16 images]
[im 5/98  soft-tissue]
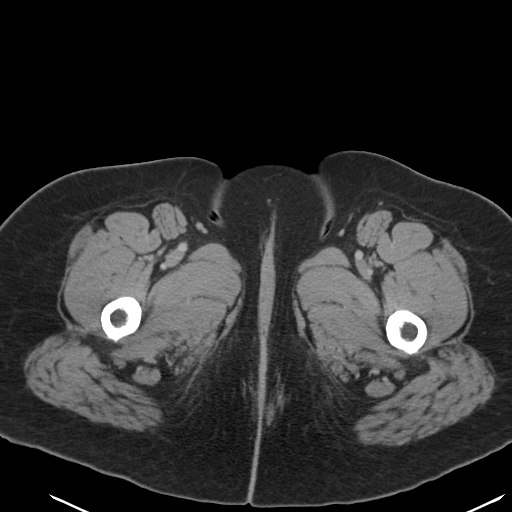
[im 5/98  bone]
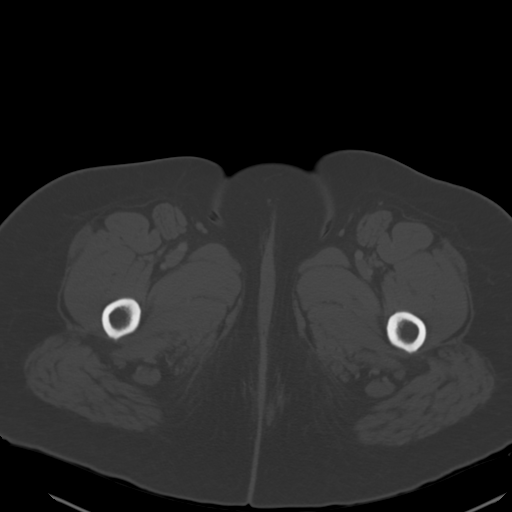
[im 14/98  soft-tissue]
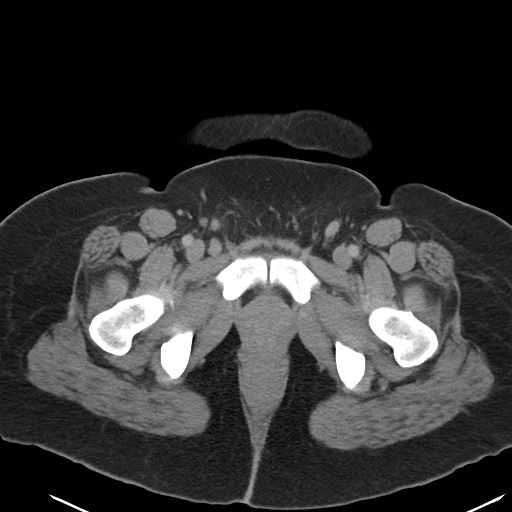
[im 18/98  soft-tissue]
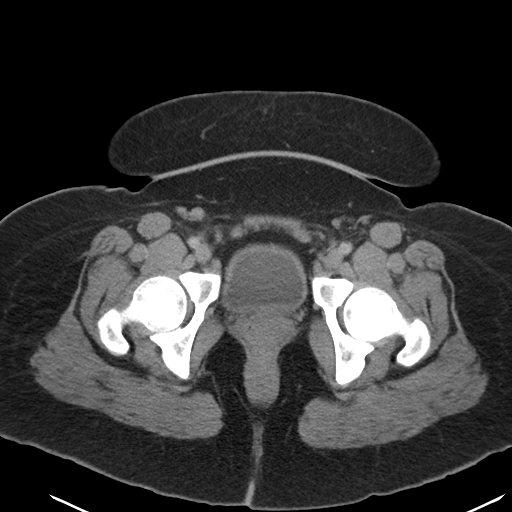
[im 27/98  soft-tissue]
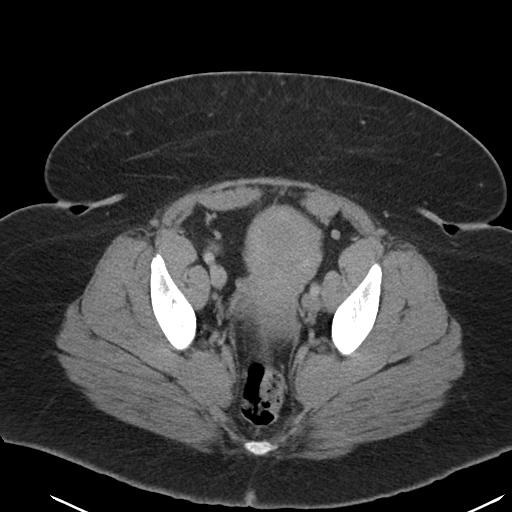
[im 31/98  soft-tissue]
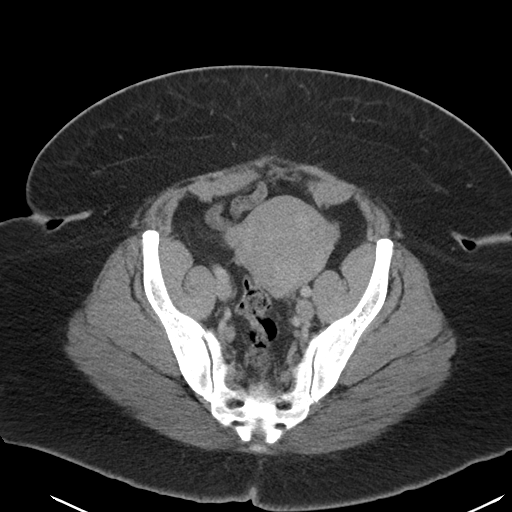
[im 40/98  soft-tissue]
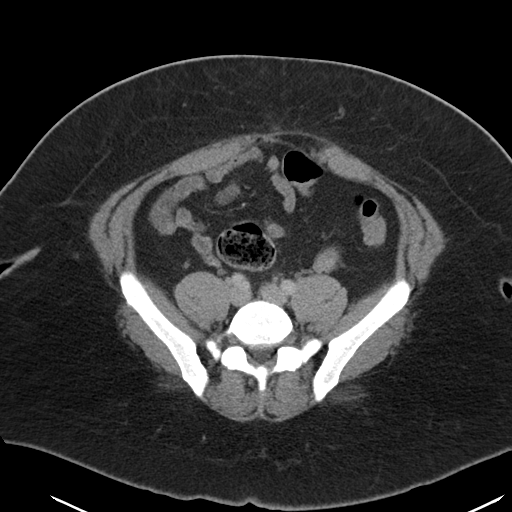
[im 45/98  soft-tissue]
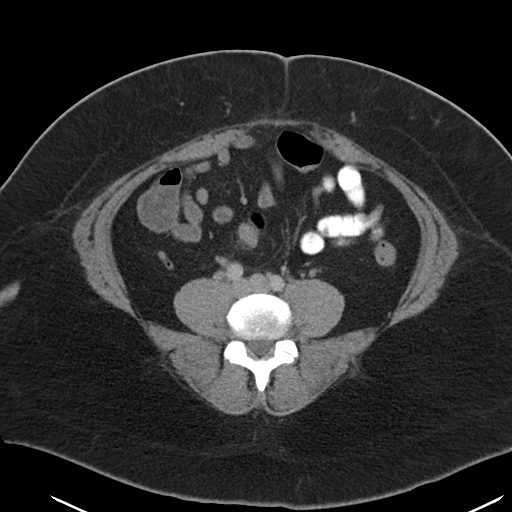
[im 53/98  soft-tissue]
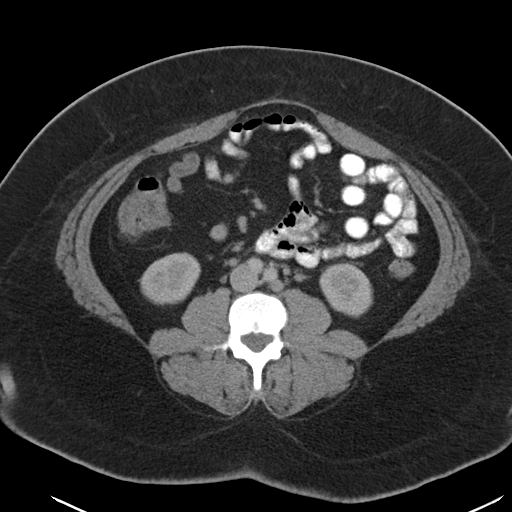
[im 58/98  soft-tissue]
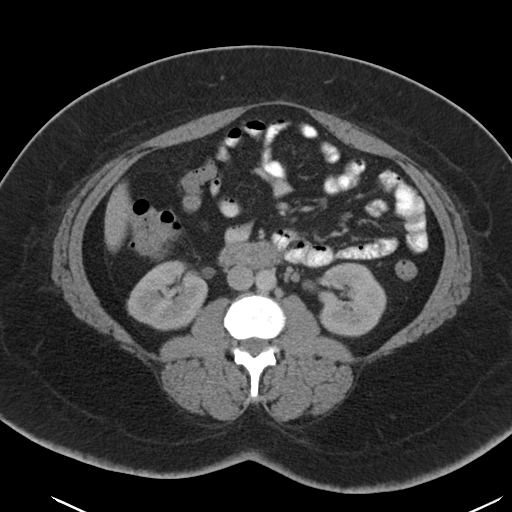
[im 58/98  bone]
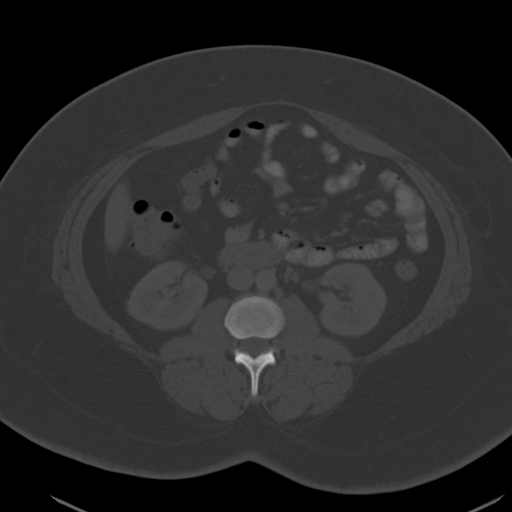
[im 67/98  soft-tissue]
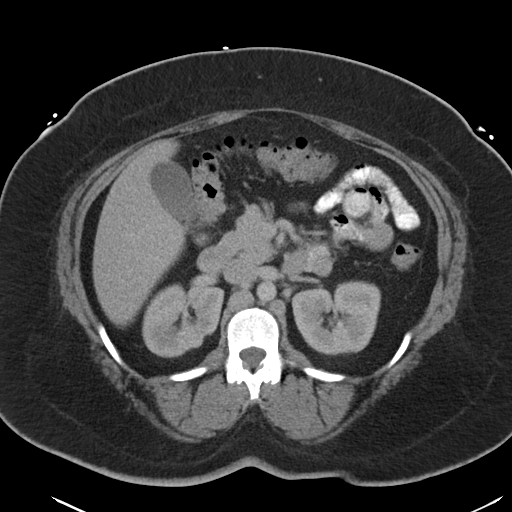
[im 71/98  soft-tissue]
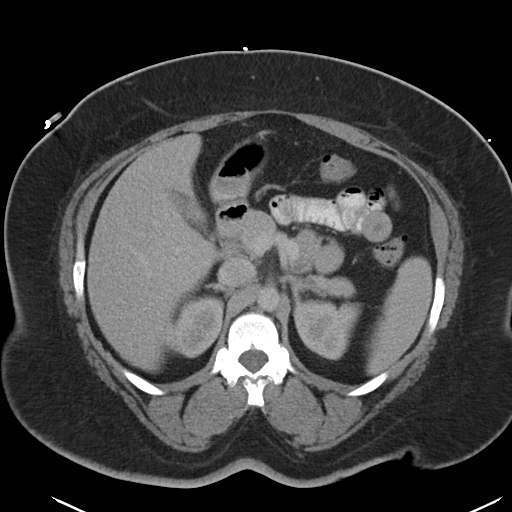
[im 80/98  soft-tissue]
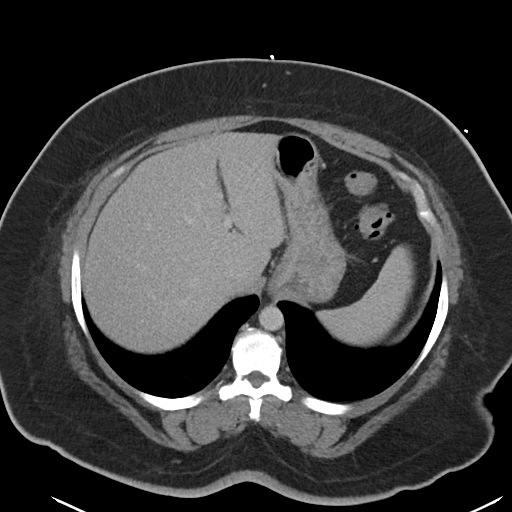
[im 84/98  soft-tissue]
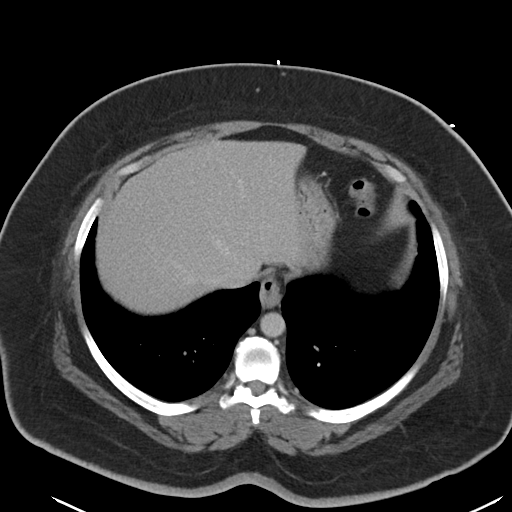
[im 93/98  soft-tissue]
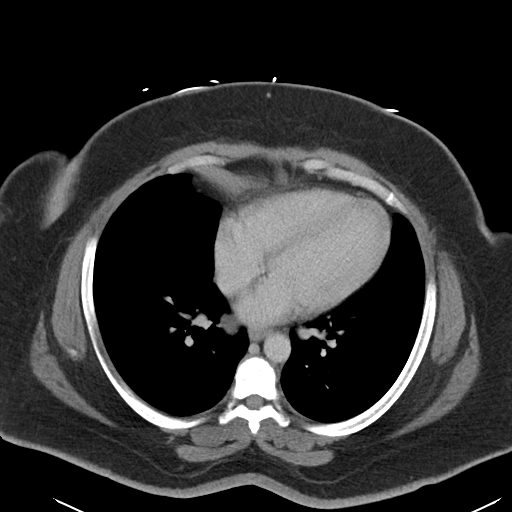

[Series 5: coronal soft tissue · coronal · 0.76mm/px · 3 of 101 slices shown]
[im 34/101  soft-tissue]
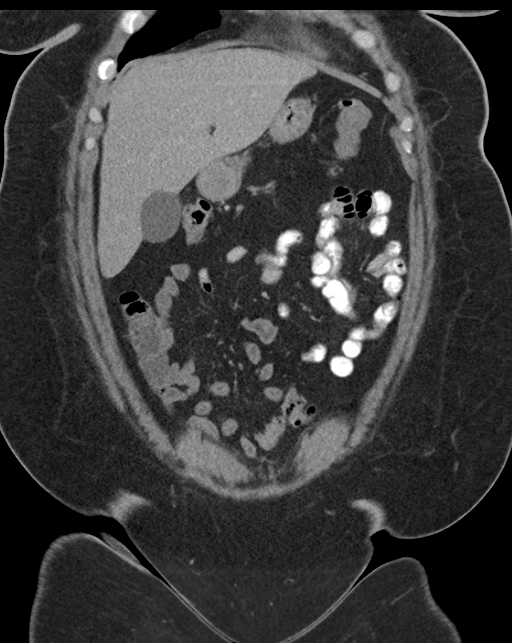
[im 45/101  soft-tissue]
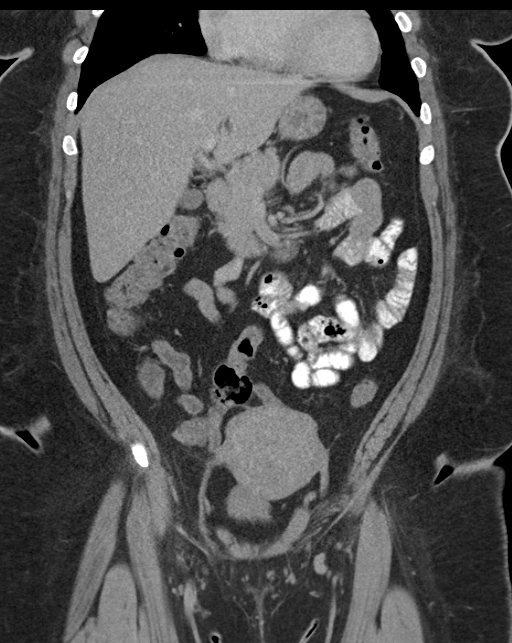
[im 56/101  soft-tissue]
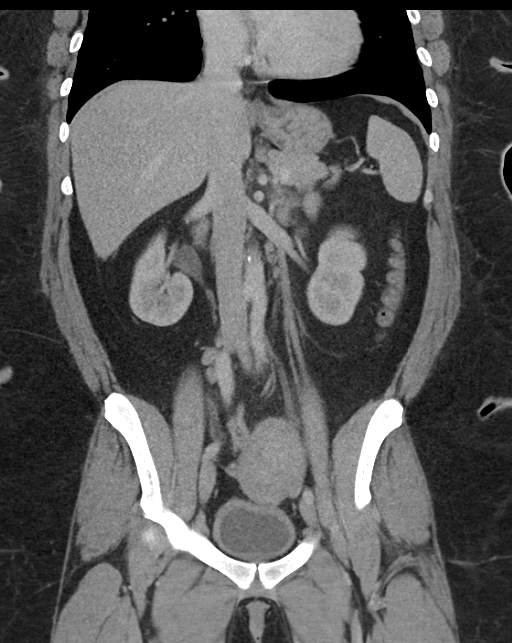

[17 of 46 positions shown; findings below may reference images not displayed]

FINDINGS: BODY WALL: No contributory findings.

LOWER CHEST: No contributory findings.

ABDOMEN/PELVIS:

Liver: No focal abnormality.

Biliary: No evidence of biliary obstruction or stone.

Pancreas: Unremarkable.

Spleen: Unremarkable.

Adrenals: Unremarkable.

Kidneys and ureters: No hydronephrosis or stone.

Bladder: Unremarkable.

Reproductive: Bulbous appearance of uterus with heterogeneous
enhancement suggesting fibroids, including a probable 25 mm
intramural anterior body fibroid. No acute finding.

Bowel: No obstruction. No appendicitis.

Retroperitoneum: No mass or adenopathy.

Peritoneum: No ascites or pneumoperitoneum.

Vascular: No acute abnormality.

OSSEOUS: No acute abnormalities.
IMPRESSION: 1. No explanation for acute pain.
2. Probable uterine fibroids.

## 2017-05-23 ENCOUNTER — Encounter (HOSPITAL_COMMUNITY): Payer: Self-pay | Admitting: Emergency Medicine

## 2017-05-23 ENCOUNTER — Emergency Department (HOSPITAL_COMMUNITY): Payer: Medicaid Other

## 2017-05-23 ENCOUNTER — Emergency Department (HOSPITAL_COMMUNITY)
Admission: EM | Admit: 2017-05-23 | Discharge: 2017-05-23 | Disposition: A | Discharge status: Home / Self Care | Payer: Medicaid Other | Attending: Physician Assistant | Admitting: Physician Assistant

## 2017-05-23 DIAGNOSIS — F17210 Nicotine dependence, cigarettes, uncomplicated: Secondary | ICD-10-CM | POA: Insufficient documentation

## 2017-05-23 DIAGNOSIS — Y999 Unspecified external cause status: Secondary | ICD-10-CM | POA: Diagnosis not present

## 2017-05-23 DIAGNOSIS — M25521 Pain in right elbow: Secondary | ICD-10-CM | POA: Insufficient documentation

## 2017-05-23 DIAGNOSIS — M25561 Pain in right knee: Secondary | ICD-10-CM | POA: Diagnosis present

## 2017-05-23 DIAGNOSIS — Y93I9 Activity, other involving external motion: Secondary | ICD-10-CM | POA: Diagnosis not present

## 2017-05-23 DIAGNOSIS — M542 Cervicalgia: Secondary | ICD-10-CM | POA: Insufficient documentation

## 2017-05-23 DIAGNOSIS — Y929 Unspecified place or not applicable: Secondary | ICD-10-CM | POA: Diagnosis not present

## 2017-05-23 MED ORDER — IBUPROFEN 800 MG PO TABS: 800.0000 mg | ORAL_TABLET | Freq: Three times a day (TID) | ORAL | 0 refills | Status: DC

## 2017-05-23 MED ORDER — CYCLOBENZAPRINE HCL 10 MG PO TABS: 10.0000 mg | ORAL_TABLET | Freq: Two times a day (BID) | ORAL | 0 refills | Status: DC | PRN
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
## 2017-05-23 NOTE — Discharge Instructions (Signed)
Please use hot and cold to help with the symptoms. Anticipate they will be worse tomorrow. Please use ibuprofen and Flexeril to help. Please return with any numbness tingling weakness or other concerns.

## 2017-05-23 NOTE — ED Notes (Signed)
Patient transported to X-ray 
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
## 2018-11-12 ENCOUNTER — Observation Stay (HOSPITAL_COMMUNITY): Payer: Self-pay

## 2018-11-12 ENCOUNTER — Emergency Department (HOSPITAL_COMMUNITY): Payer: Self-pay

## 2018-11-12 ENCOUNTER — Encounter (HOSPITAL_COMMUNITY): Payer: Self-pay

## 2018-11-12 ENCOUNTER — Observation Stay (HOSPITAL_COMMUNITY)
Admission: EM | Admit: 2018-11-12 | Discharge: 2018-11-12 | Disposition: A | Discharge status: Home / Self Care | Payer: Self-pay | Attending: Internal Medicine | Admitting: Internal Medicine

## 2018-11-12 ENCOUNTER — Other Ambulatory Visit: Payer: Self-pay

## 2018-11-12 DIAGNOSIS — Z6841 Body Mass Index (BMI) 40.0 and over, adult: Secondary | ICD-10-CM | POA: Insufficient documentation

## 2018-11-12 DIAGNOSIS — D251 Intramural leiomyoma of uterus: Secondary | ICD-10-CM

## 2018-11-12 DIAGNOSIS — D649 Anemia, unspecified: Principal | ICD-10-CM | POA: Diagnosis present

## 2018-11-12 DIAGNOSIS — N920 Excessive and frequent menstruation with regular cycle: Secondary | ICD-10-CM | POA: Insufficient documentation

## 2018-11-12 DIAGNOSIS — Z791 Long term (current) use of non-steroidal anti-inflammatories (NSAID): Secondary | ICD-10-CM | POA: Insufficient documentation

## 2018-11-12 DIAGNOSIS — Z8249 Family history of ischemic heart disease and other diseases of the circulatory system: Secondary | ICD-10-CM | POA: Insufficient documentation

## 2018-11-12 DIAGNOSIS — F17210 Nicotine dependence, cigarettes, uncomplicated: Secondary | ICD-10-CM | POA: Insufficient documentation

## 2018-11-12 DIAGNOSIS — Z79899 Other long term (current) drug therapy: Secondary | ICD-10-CM | POA: Insufficient documentation

## 2018-11-12 DIAGNOSIS — D219 Benign neoplasm of connective and other soft tissue, unspecified: Secondary | ICD-10-CM

## 2018-11-12 DIAGNOSIS — D259 Leiomyoma of uterus, unspecified: Secondary | ICD-10-CM | POA: Diagnosis present

## 2018-11-12 DIAGNOSIS — E669 Obesity, unspecified: Secondary | ICD-10-CM | POA: Insufficient documentation

## 2018-11-12 LAB — IRON AND TIBC
Iron: 11 ug/dL — ABNORMAL LOW (ref 28–170)
Saturation Ratios: 3 % — ABNORMAL LOW (ref 10.4–31.8)
TIBC: 434 ug/dL (ref 250–450)
UIBC: 423 ug/dL

## 2018-11-12 LAB — HEMOGLOBIN AND HEMATOCRIT, BLOOD
HCT: 27.1 % — ABNORMAL LOW (ref 36.0–46.0)
HCT: 29.1 % — ABNORMAL LOW (ref 36.0–46.0)
Hemoglobin: 7.2 g/dL — ABNORMAL LOW (ref 12.0–15.0)
Hemoglobin: 8.1 g/dL — ABNORMAL LOW (ref 12.0–15.0)

## 2018-11-12 LAB — CBC
HCT: 23.3 % — ABNORMAL LOW (ref 36.0–46.0)
Hemoglobin: 6 g/dL — CL (ref 12.0–15.0)
MCH: 16.6 pg — AB (ref 26.0–34.0)
MCHC: 25.8 g/dL — ABNORMAL LOW (ref 30.0–36.0)
MCV: 64.5 fL — ABNORMAL LOW (ref 80.0–100.0)
Platelets: 264 10*3/uL (ref 150–400)
RBC: 3.61 MIL/uL — AB (ref 3.87–5.11)
RDW: 26.5 % — ABNORMAL HIGH (ref 11.5–15.5)
WBC: 10.8 10*3/uL — ABNORMAL HIGH (ref 4.0–10.5)
nRBC: 0 % (ref 0.0–0.2)

## 2018-11-12 LAB — BASIC METABOLIC PANEL
Anion gap: 10 (ref 5–15)
BUN: 6 mg/dL (ref 6–20)
CO2: 23 mmol/L (ref 22–32)
Calcium: 8.6 mg/dL — ABNORMAL LOW (ref 8.9–10.3)
Chloride: 104 mmol/L (ref 98–111)
Creatinine, Ser: 0.7 mg/dL (ref 0.44–1.00)
GFR calc Af Amer: >60 mL/min (ref >60)
GFR calc non Af Amer: >60 mL/min (ref >60)
Glucose, Bld: 103 mg/dL — ABNORMAL HIGH (ref 70–99)
Potassium: 3.8 mmol/L (ref 3.5–5.1)
Sodium: 137 mmol/L (ref 135–145)

## 2018-11-12 LAB — I-STAT BETA HCG BLOOD, ED (MC, WL, AP ONLY): I-stat hCG, quantitative: <5 m[IU]/mL (ref <5)

## 2018-11-12 LAB — I-STAT TROPONIN, ED: Troponin i, poc: 0 ng/mL (ref 0.00–0.08)

## 2018-11-12 LAB — PREPARE RBC (CROSSMATCH)

## 2018-11-12 LAB — FERRITIN: Ferritin: 5 ng/mL — ABNORMAL LOW (ref 11–307)

## 2018-11-12 MED ORDER — ENSURE ENLIVE PO LIQD
237.0000 mL | Freq: Two times a day (BID) | ORAL | Status: DC
  Administered 2018-11-12: 237 mL via ORAL

## 2018-11-12 MED ORDER — ONDANSETRON HCL 4 MG PO TABS: 4.0000 mg | ORAL_TABLET | Freq: Four times a day (QID) | ORAL | Status: DC | PRN

## 2018-11-12 MED ORDER — ACETAMINOPHEN 650 MG RE SUPP: 650.0000 mg | Freq: Four times a day (QID) | RECTAL | Status: DC | PRN

## 2018-11-12 MED ORDER — ONDANSETRON HCL 4 MG/2ML IJ SOLN: 4.0000 mg | Freq: Four times a day (QID) | INTRAMUSCULAR | Status: DC | PRN

## 2018-11-12 MED ORDER — SODIUM CHLORIDE 0.9 % IV SOLN: 10.0000 mL/h | Freq: Once | INTRAVENOUS | Status: DC

## 2018-11-12 MED ORDER — ACETAMINOPHEN 325 MG PO TABS
650.0000 mg | ORAL_TABLET | Freq: Four times a day (QID) | ORAL | Status: DC | PRN
  Administered 2018-11-12: 650 mg via ORAL
  Filled 2018-11-12: qty 2

## 2018-11-12 NOTE — ED Provider Notes (Addendum)
[facility] EMERGENCY DEPARTMENT Provider Note   CSN: [id number] Arrival date & time: 11/12/18  0034     History   Chief Complaint Chief Complaint  Patient presents with  . Chest Pain    HPI Jordan Pruitt is a 42 y.o. female.  HPI Patient presents with shortness of breath fatigue and chest pain.  Has been going for a while now but worse last few days.  History of anemia.  States she is on iron.  States she has had some cramping in her legs at times.  States her legs are cramping then she will feel short of breath.  Has a history of very heavy periods.  Does not have a primary care doctor at this time.  No blood in the stool or black stools.  Does not have a primary care doctor due to insurance reasons. Past Medical History:  Diagnosis Date  . Anemia   . Menorrhagia   . Obesity   . Paronychia     Patient Active Problem List   Diagnosis Date Noted  . Symptomatic anemia 11/12/2018  . Cellulitis 10/24/2014  . Sepsis ([place]) 10/24/2014  . Anemia 10/24/2014  . Sinus tachycardia 10/24/2014  . Leukocytosis 10/24/2014  . Absolute anemia     Past Surgical History:  Procedure Laterality Date  . CESAREAN SECTION     x4  . TUBAL LIGATION       OB History   No obstetric history on file.      Home Medications    Prior to Admission medications   Medication Sig Start Date End Date Taking? Authorizing Provider  cyclobenzaprine (FLEXERIL) 10 MG tablet Take 1 tablet (10 mg total) by mouth 2 (two) times daily as needed for muscle spasms. Patient not taking: Reported on 11/12/2018 05/23/17   Mackuen, Courteney Lyn, MD  cyclobenzaprine (FLEXERIL) 10 MG tablet Take 1 tablet (10 mg total) by mouth 2 (two) times daily as needed for muscle spasms. Patient not taking: Reported on 11/12/2018 05/23/17   Mackuen, Courteney Lyn, MD  ibuprofen (ADVIL,MOTRIN) 800 MG tablet Take 1 tablet (800 mg total) by mouth 3 (three) times daily. Patient not taking: Reported on  11/12/2018 05/23/17   Mackuen, Fredia Sorrow, MD  meclizine (ANTIVERT) 25 MG tablet Take 1 tablet (25 mg total) by mouth 3 (three) times daily as needed for dizziness. Patient not taking: Reported on 11/12/2018 04/08/16   Delos Haring, PA-C    Family History Family History  Problem Relation Age of Onset  . Hypertension Father   . Hypertension Mother   . Diabetes Mother   . Diabetes Maternal Aunt     Social History Social History   Tobacco Use  . Smoking status: Current Every Day Smoker    Packs/day: 0.50    Types: Cigarettes  . Smokeless tobacco: Never Used  Substance Use Topics  . Alcohol use: Yes  . Drug use: No     Allergies   Ibuprofen   Review of Systems Review of Systems  Constitutional: Positive for fatigue. Negative for appetite change.  HENT: Negative for congestion.   Respiratory: Positive for shortness of breath.   Cardiovascular: Positive for chest pain.  Gastrointestinal: Negative for abdominal pain.  Genitourinary: Positive for vaginal bleeding.  Musculoskeletal: Negative for back pain.  Neurological: Positive for weakness.  Psychiatric/Behavioral: Negative for confusion.     Physical Exam Updated Vital Signs BP (!) 114/54   Pulse 94   Temp 99 F (37.2 C)   Resp (!)  27   Ht 5\' 2"  (1.575 m)   Wt 123.4 kg   LMP 11/08/2018   SpO2 100%   BMI 49.75 kg/m   Physical Exam Eyes:     Pupils: Pupils are equal, round, and reactive to light.  Neck:     Musculoskeletal: Neck supple.  Cardiovascular:     Rate and Rhythm: Normal rate and regular rhythm.  Skin:    Capillary Refill: Capillary refill takes less than 2 seconds.     Coloration: Skin is pale.  Neurological:     Mental Status: She is alert.     Cranial Nerves: No cranial nerve deficit.      ED Treatments / Results  Labs (all labs ordered are listed, but only abnormal results are displayed) Labs Reviewed  BASIC METABOLIC PANEL - Abnormal; Notable for the following components:       Result Value   Glucose, Bld 103 (*)    Calcium 8.6 (*)    All other components within normal limits  CBC - Abnormal; Notable for the following components:   WBC 10.8 (*)    RBC 3.61 (*)    Hemoglobin 6.0 (*)    HCT 23.3 (*)    MCV 64.5 (*)    MCH 16.6 (*)    MCHC 25.8 (*)    RDW 26.5 (*)    All other components within normal limits  IRON AND TIBC - Abnormal; Notable for the following components:   Iron 11 (*)    Saturation Ratios 3 (*)    All other components within normal limits  FERRITIN - Abnormal; Notable for the following components:   Ferritin 5 (*)    All other components within normal limits  HIV ANTIBODY (ROUTINE TESTING W REFLEX)  HEMOGLOBIN AND HEMATOCRIT, BLOOD  I-STAT TROPONIN, ED  I-STAT BETA HCG BLOOD, ED ([facility], [facility], [facility] ONLY)  PREPARE RBC (CROSSMATCH)  TYPE AND SCREEN    EKG EKG Interpretation  Date/Time:  Sunday November 12 2018 02:58:57 EST Ventricular Rate:  91 PR Interval:    QRS Duration: 88 QT Interval:  353 QTC Calculation: 435 R Axis:   53 Text Interpretation:  Sinus rhythm Confirmed by Davonna Belling [phone]) on 11/12/2018 3:08:23 AM   Radiology Dg Chest 2 View  Result Date: 11/12/2018 CLINICAL DATA:  Acute onset of generalized chest pain, chills and body aches. EXAM: CHEST - 2 VIEW COMPARISON:  Chest radiograph performed 04/07/2016 FINDINGS: The lungs are well-aerated and clear. There is no evidence of focal opacification, pleural effusion or pneumothorax. The heart is normal in size; the mediastinal contour is within normal limits. No acute osseous abnormalities are seen. IMPRESSION: No acute cardiopulmonary process seen. Electronically Signed   By: Garald Balding M.D.   On: 11/12/2018 01:13    Procedures Procedures (including critical care time)  Medications Ordered in ED Medications  0.9 %  sodium chloride infusion (has no administration in time range)  acetaminophen (TYLENOL) tablet 650 mg (has no administration in time range)     Or  acetaminophen (TYLENOL) suppository 650 mg (has no administration in time range)  ondansetron (ZOFRAN) tablet 4 mg (has no administration in time range)    Or  ondansetron (ZOFRAN) injection 4 mg (has no administration in time range)     Initial Impression / Assessment and Plan / ED Course  I have reviewed the triage vital signs and the nursing notes.  Pertinent labs & imaging results that were available during my care of the patient were reviewed  by me and considered in my medical decision making (see chart for details).     Patient with symptomatic anemia.  Hemoglobin is 6.  EKG reassuring.Patient with more fatigue and more difficulty walking.  Will admit for transfusion and will start transfusion in the ER.  CRITICAL CARE Performed by: Davonna Belling Total critical care time: 30 minutes Critical care time was exclusive of separately billable procedures and treating other patients. Critical care was necessary to treat or prevent imminent or life-threatening deterioration. Critical care was time spent personally by me on the following activities: development of treatment plan with patient and/or surrogate as well as nursing, discussions with consultants, evaluation of patient's response to treatment, examination of patient, obtaining history from patient or surrogate, ordering and performing treatments and interventions, ordering and review of laboratory studies, ordering and review of radiographic studies, pulse oximetry and re-evaluation of patient's condition.   Final Clinical Impressions(s) / ED Diagnoses   Final diagnoses:  Anemia, unspecified type    ED Discharge Orders    None       Davonna Belling, MD 11/12/18 Anola Gurney    Davonna Belling, MD 11/12/18 [phone]

## 2018-11-12 NOTE — Care Management (Signed)
Information placed on [facility] f/u and other local clinics for PCP.  Pt will need to make appointments as soon as possible.

## 2018-11-12 NOTE — ED Triage Notes (Signed)
Patient c/o CP with SOB. Denies vomiting.

## 2018-11-12 NOTE — H&P (Signed)
History and Physical    JURNEY OVERACKER [id number]:[id number] DOB: Jul 26, 1976 DOA: 11/12/2018  PCP: Patient, No Pcp Per  Patient coming from: Home  I have personally briefly reviewed patient's old medical records in [place]  Chief Complaint: SOB, fatigue  HPI: Jordan Pruitt is a 42 y.o. female with medical history significant of Anemia, menorrhagia.  Patient presents to the ED with c/o SOB, CP, and fatigue.  Has been going on for a while now but worse last few days.  H/o Anemia due to menorrhagia, is on iron.  No PCP currently.  No melena, hematochezia, or other GI symptoms.   ED Course: HGB 6.0 down from 6.5 back in 2017   Review of Systems: As per HPI otherwise 10 point review of systems negative.   Past Medical History:  Diagnosis Date  . Anemia   . Menorrhagia   . Obesity   . Paronychia     Past Surgical History:  Procedure Laterality Date  . CESAREAN SECTION     x4  . TUBAL LIGATION       reports that she has been smoking cigarettes. She has been smoking about 0.50 packs per day. She has never used smokeless tobacco. She reports current alcohol use. She reports that she does not use drugs.  Allergies  Allergen Reactions  . Ibuprofen Itching    Itching w/ Rx 800mg ; able to take lower OTC doses without any problems    Family History  Problem Relation Age of Onset  . Hypertension Father   . Hypertension Mother   . Diabetes Mother   . Diabetes Maternal Aunt      Prior to Admission medications   Medication Sig Start Date End Date Taking? Authorizing Provider  cyclobenzaprine (FLEXERIL) 10 MG tablet Take 1 tablet (10 mg total) by mouth 2 (two) times daily as needed for muscle spasms. Patient not taking: Reported on 11/12/2018 05/23/17   Mackuen, Courteney Lyn, MD  cyclobenzaprine (FLEXERIL) 10 MG tablet Take 1 tablet (10 mg total) by mouth 2 (two) times daily as needed for muscle spasms. Patient not taking: Reported on 11/12/2018 05/23/17   Mackuen, Courteney  Lyn, MD  ibuprofen (ADVIL,MOTRIN) 800 MG tablet Take 1 tablet (800 mg total) by mouth 3 (three) times daily. Patient not taking: Reported on 11/12/2018 05/23/17   Mackuen, Fredia Sorrow, MD  meclizine (ANTIVERT) 25 MG tablet Take 1 tablet (25 mg total) by mouth 3 (three) times daily as needed for dizziness. Patient not taking: Reported on 11/12/2018 04/08/16   Delos Haring, PA-C    Physical Exam: Vitals:   11/12/18 0046 11/12/18 0047 11/12/18 0251 11/12/18 0255  BP: (!) 146/80  (!) 142/75   Pulse: (!) 101     Resp: 20   13  Temp: 99.1 F (37.3 C)     TempSrc: Oral     SpO2: 99%     Weight:  123.4 kg    Height:  5\' 2"  (1.575 m)      Constitutional: NAD, calm, comfortable Eyes: PERRL, lids and conjunctivae normal ENMT: Mucous membranes are moist. Posterior pharynx clear of any exudate or lesions.Normal dentition.  Neck: normal, supple, no masses, no thyromegaly Respiratory: clear to auscultation bilaterally, no wheezing, no crackles. Normal respiratory effort. No accessory muscle use.  Cardiovascular: Regular rate and rhythm, no murmurs / rubs / gallops. No extremity edema. 2+ pedal pulses. No carotid bruits.  Abdomen: no tenderness, no masses palpated. No hepatosplenomegaly. Bowel sounds positive.  Musculoskeletal: no clubbing /  cyanosis. No joint deformity upper and lower extremities. Good ROM, no contractures. Normal muscle tone.  Skin: no rashes, lesions, ulcers. No induration Neurologic: CN 2-12 grossly intact. Sensation intact, DTR normal. Strength 5/5 in all 4.  Psychiatric: Normal judgment and insight. Alert and oriented x 3. Normal mood.    Labs on Admission: I have personally reviewed following labs and imaging studies  CBC: Recent Labs  Lab 11/12/18 0056  WBC 10.8*  HGB 6.0*  HCT 23.3*  MCV 64.5*  PLT 024   Basic Metabolic Panel: Recent Labs  Lab 11/12/18 0056  NA 137  K 3.8  CL 104  CO2 23  GLUCOSE 103*  BUN 6  CREATININE 0.70  CALCIUM 8.6*    GFR: Estimated Creatinine Clearance: 117.2 mL/min (by C-G formula based on SCr of 0.7 mg/dL). Liver Function Tests: No results for input(s): AST, ALT, ALKPHOS, BILITOT, PROT, ALBUMIN in the last 168 hours. No results for input(s): LIPASE, AMYLASE in the last 168 hours. No results for input(s): AMMONIA in the last 168 hours. Coagulation Profile: No results for input(s): INR, PROTIME in the last 168 hours. Cardiac Enzymes: No results for input(s): CKTOTAL, CKMB, CKMBINDEX, TROPONINI in the last 168 hours. BNP (last 3 results) No results for input(s): PROBNP in the last 8760 hours. HbA1C: No results for input(s): HGBA1C in the last 72 hours. CBG: No results for input(s): GLUCAP in the last 168 hours. Lipid Profile: No results for input(s): CHOL, HDL, LDLCALC, TRIG, CHOLHDL, LDLDIRECT in the last 72 hours. Thyroid Function Tests: No results for input(s): TSH, T4TOTAL, FREET4, T3FREE, THYROIDAB in the last 72 hours. Anemia Panel: No results for input(s): VITAMINB12, FOLATE, FERRITIN, TIBC, IRON, RETICCTPCT in the last 72 hours. Urine analysis:    Component Value Date/Time   COLORURINE YELLOW 04/07/2016 2140   APPEARANCEUR CLOUDY (A) 04/07/2016 2140   LABSPEC 1.025 04/07/2016 2140   PHURINE 5.5 04/07/2016 2140   GLUCOSEU NEGATIVE 04/07/2016 2140   HGBUR LARGE (A) 04/07/2016 2140   BILIRUBINUR NEGATIVE 04/07/2016 2140   KETONESUR NEGATIVE 04/07/2016 2140   PROTEINUR NEGATIVE 04/07/2016 2140   UROBILINOGEN 1.0 07/03/2015 2043   NITRITE NEGATIVE 04/07/2016 2140   LEUKOCYTESUR NEGATIVE 04/07/2016 2140    Radiological Exams on Admission: Dg Chest 2 View  Result Date: 11/12/2018 CLINICAL DATA:  Acute onset of generalized chest pain, chills and body aches. EXAM: CHEST - 2 VIEW COMPARISON:  Chest radiograph performed 04/07/2016 FINDINGS: The lungs are well-aerated and clear. There is no evidence of focal opacification, pleural effusion or pneumothorax. The heart is normal in size;  the mediastinal contour is within normal limits. No acute osseous abnormalities are seen. IMPRESSION: No acute cardiopulmonary process seen. Electronically Signed   By: Garald Balding M.D.   On: 11/12/2018 01:13    EKG: Independently reviewed.  Assessment/Plan Active Problems:   Symptomatic anemia    1. Symptomatic anemia - 1. 2u PRBC transfusion ordered 2. Repeat H/H at 0800 after transfusion 3. Needs follow up with GYN 4. Iron pending  DVT prophylaxis: SCDs Code Status: Full Family Communication: No family in room Disposition Plan: Home after admit Consults called: None Admission status: Place in obs    ,  M. DO [facility] Pager [phone] Only works nights!  If 7AM-7PM, please contact the primary day team physician taking care of patient  [email or web address] Password [email or web address]  11/12/2018, 3:15 AM

## 2018-11-12 NOTE — Discharge Summary (Signed)
Physician Discharge Summary  BURNETTA KOHLS WSF:[id number] DOB: [birth year] DOA: 11/12/2018  PCP: Patient, No Pcp Per  Admit date: 11/12/2018 Discharge date: 11/12/2018  Admitted From: Home Disposition: Home  Recommendations for Outpatient Follow-up:  1. Follow up with PCP in 1-2 weeks [facility] 2. Please obtain BMP/CBC in one week  Home Health: No Equipment/Devices: None  Discharge Condition: Stable CODE STATUS: Full Diet recommendation: Regular  Brief/Interim Summary: Jordan Pruitt is a 42 y.o. female with medical history significant of Anemia, menorrhagia.  Patient presents to the ED with c/o SOB, CP, and fatigue.  Has been going on for a while now but worse last few days.  H/o Anemia due to menorrhagia, is on iron.  No PCP currently.  No melena, hematochezia, or other GI symptoms.  She was admitted to the hospital and transfused 2 units of packed red blood cells due to a hemoglobin of 6.0.  After transfusion her hemoglobin is 8.1.  She is feeling significantly better.  An ultrasound of the uterus was obtained and revealed a 7.9 x 7.6 x 6 cm large fibroid in the uterus.  Patient will need follow-up with GYN as she has had significant enough bleeding to be admitted and transfused.  She may require medication or surgical management.  Patient has reached maximal benefit of hospitalization.  Discharge diagnosis, prognosis, plans, follow-up, medications and treatments discussed with the patient(or responsible party) and is in agreement with the plans as described.  Patient is stable for discharge.  Discharge Diagnoses:  Active Problems:   Symptomatic anemia   Uterine fibroid    Discharge Instructions  Discharge Instructions    Diet - low sodium heart healthy   Complete by:  As directed    Diet - low sodium heart healthy   Complete by:  As directed    Increase activity slowly   Complete by:  As directed    Increase activity slowly   Complete by:  As  directed      Allergies as of 11/12/2018      Reactions   Ibuprofen Itching   Itching w/ Rx 800mg ; able to take lower OTC doses without any problems      Medication List    STOP taking these medications   cyclobenzaprine 10 MG tablet Commonly known as:  FLEXERIL   ibuprofen 800 MG tablet Commonly known as:  ADVIL,MOTRIN   meclizine 25 MG tablet Commonly known as:  ANTIVERT       Allergies  Allergen Reactions  . Ibuprofen Itching    Itching w/ Rx 800mg ; able to take lower OTC doses without any problems    Consultations:  None   Procedures/Studies: Dg Chest 2 View  Result Date: 11/12/2018 CLINICAL DATA:  Acute onset of generalized chest pain, chills and body aches. EXAM: CHEST - 2 VIEW COMPARISON:  Chest radiograph performed 04/07/2016 FINDINGS: The lungs are well-aerated and clear. There is no evidence of focal opacification, pleural effusion or pneumothorax. The heart is normal in size; the mediastinal contour is within normal limits. No acute osseous abnormalities are seen. IMPRESSION: No acute cardiopulmonary process seen. Electronically Signed   By: Garald Balding M.D.   On: 11/12/2018 01:13   US Pelvic Complete With Transvaginal  Result Date: 11/12/2018 CLINICAL DATA:  Heavy vaginal bleeding.  Fibroids. EXAM: TRANSABDOMINAL AND TRANSVAGINAL ULTRASOUND OF PELVIS TECHNIQUE: Both transabdominal and transvaginal ultrasound examinations of the pelvis were performed. Transabdominal technique was performed for global imaging of the pelvis including uterus, ovaries,  adnexal regions, and pelvic cul-de-sac. It was necessary to proceed with endovaginal exam following the transabdominal exam to visualize the uterus and adnexal regions. COMPARISON:  CT scan of July 03, 2015. FINDINGS: Uterus Measurements: 12.1 x 7.9 x 7.4 cm = volume: 367 mL. Probable large fibroid seen in the fundus measuring 7.9 x 7.5 x 6.6 cm. Endometrium Not visualized due to body habitus and previously  described fibroid. Right ovary Not visualized due to body habitus. Left ovary Not visualized due to body habitus. Other findings No abnormal free fluid. IMPRESSION: Limited examination due to body habitus. Ovaries are not visualized. Probable 7.9 cm fibroid is noted in the uterine fundus. Electronically Signed   By: Marijo Conception, M.D.   On: 11/12/2018 15:19      Subjective:   Discharge Exam: Vitals:   11/12/18 1050 11/12/18 1255  BP: 127/75 138/75  Pulse: 88 77  Resp: 16 16  Temp: 98.6 F (37 C) 98.6 F (37 C)  SpO2: 100% 100%   Vitals:   11/12/18 0829 11/12/18 1010 11/12/18 1050 11/12/18 1255  BP: (!) 141/71 (!) 142/75 127/75 138/75  Pulse: 83 88 88 77  Resp:  16 16 16   Temp: 98.7 F (37.1 C) 98.6 F (37 C) 98.6 F (37 C) 98.6 F (37 C)  TempSrc: Oral Oral Oral Oral  SpO2: 100% 100% 100% 100%  Weight:      Height:        General: Pt is alert, awake, not in acute distress Cardiovascular: RRR, S1/S2 +, no rubs, no gallops Respiratory: CTA bilaterally, no wheezing, no rhonchi Abdominal: Soft, NT, ND, bowel sounds + Extremities: no edema, no cyanosis    The results of significant diagnostics from this hospitalization (including imaging, microbiology, ancillary and laboratory) are listed below for reference.     Microbiology: No results found for this or any previous visit (from the past 240 hour(s)).   Labs: BNP (last 3 results) No results for input(s): BNP in the last 8760 hours. Basic Metabolic Panel: Recent Labs  Lab 11/12/18 0056  NA 137  K 3.8  CL 104  CO2 23  GLUCOSE 103*  BUN 6  CREATININE 0.70  CALCIUM 8.6*   Liver Function Tests: No results for input(s): AST, ALT, ALKPHOS, BILITOT, PROT, ALBUMIN in the last 168 hours. No results for input(s): LIPASE, AMYLASE in the last 168 hours. No results for input(s): AMMONIA in the last 168 hours. CBC: Recent Labs  Lab 11/12/18 0056 11/12/18 0824 11/12/18 1548  WBC 10.8*  --   --   HGB 6.0*  7.2* 8.1*  HCT 23.3* 27.1* 29.1*  MCV 64.5*  --   --   PLT 264  --   --    Cardiac Enzymes: No results for input(s): CKTOTAL, CKMB, CKMBINDEX, TROPONINI in the last 168 hours. BNP: Invalid input(s): POCBNP CBG: No results for input(s): GLUCAP in the last 168 hours. D-Dimer No results for input(s): DDIMER in the last 72 hours. Hgb A1c No results for input(s): HGBA1C in the last 72 hours. Lipid Profile No results for input(s): CHOL, HDL, LDLCALC, TRIG, CHOLHDL, LDLDIRECT in the last 72 hours. Thyroid function studies No results for input(s): TSH, T4TOTAL, T3FREE, THYROIDAB in the last 72 hours.  Invalid input(s): FREET3 Anemia work up Recent Labs    11/12/18 0320  FERRITIN 5*  TIBC 434  IRON 11*   Urinalysis    Component Value Date/Time   COLORURINE YELLOW 04/07/2016 2140   APPEARANCEUR CLOUDY (A) 04/07/2016 2140  LABSPEC 1.025 04/07/2016 2140   PHURINE 5.5 04/07/2016 2140   GLUCOSEU NEGATIVE 04/07/2016 2140   HGBUR LARGE (A) 04/07/2016 2140   BILIRUBINUR NEGATIVE 04/07/2016 2140   KETONESUR NEGATIVE 04/07/2016 2140   PROTEINUR NEGATIVE 04/07/2016 2140   UROBILINOGEN 1.0 07/03/2015 2043   NITRITE NEGATIVE 04/07/2016 2140   LEUKOCYTESUR NEGATIVE 04/07/2016 2140   Sepsis Labs Invalid input(s): PROCALCITONIN,  WBC,  LACTICIDVEN Microbiology No results found for this or any previous visit (from the past 240 hour(s)).   Time coordinating discharge: 38 minutes  SIGNED:   Lady Deutscher, MD  FACP [facility] 11/12/2018, 4:59 PM Pager   If 7PM-7AM, please contact night-coverage [email or web address] Password [email or web address]

## 2018-11-13 LAB — BPAM RBC
Blood Product Expiration Date: 202001232359
Blood Product Expiration Date: 202001232359
ISSUE DATE / TIME: 201912291018
ISSUE DATE / TIME: 201912290521
Unit Type and Rh: 5100
Unit Type and Rh: 5100

## 2018-11-13 LAB — TYPE AND SCREEN
ABO/RH(D): O POS
Antibody Screen: NEGATIVE
Unit division: 0
Unit division: 0

## 2018-11-13 LAB — HIV ANTIBODY (ROUTINE TESTING W REFLEX): HIV Screen 4th Generation wRfx: NONREACTIVE

## 2018-12-18 NOTE — Progress Notes (Signed)
Subjective:    Patient ID: Jordan Pruitt, female    DOB: [birth year], 43 y.o.   MRN: [id number]  This is a 43 year old female who was admitted on 29 December and discharged the same day for significant symptomatic iron deficiency anemia.  The patient has had excess menorrhagia for over 3 years.  The patient developed increasing lightheadedness and weakness and presented to the emergency room and was admitted for a 2 unit packed red cell transfusion on 11/12/2018.  Since discharge her dizziness has stabilized and her weakness has improved.  However her menorrhagia continues.  During that admission pelvic ultrasound showed a large fibroid in her uterus as the source for the bleeding.  She does not have a current gynecologist.  She is here today for post hospital follow-up and to establish care and transition.  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  There is no pre-existing history of hypertension or diabetes  The patient does admit to polyuria. Note the patient declines a flu vaccine today  She is due a Pap smear  The patient also request a mammogram for screening for breast cancer    Past Medical History:  Diagnosis Date  . Anemia   . Menorrhagia   . Obesity   . Paronychia      Family History  Problem Relation Age of Onset  . Hypertension Father   . Hypertension Mother   . Diabetes Mother   . Diabetes Maternal Aunt      Social History   Socioeconomic History  . Marital status: Single    Spouse name: Not on file  . Number of children: Not on file  . Years of education: Not on file  . Highest education level: Not on file  Occupational History  . Not on file  Social Needs  . Financial resource strain: Not on file  . Food insecurity:    Worry: Not on file    Inability: Not on file  . Transportation needs:    Medical: Not on file    Non-medical: Not on file  Tobacco Use  . Smoking status: Former Smoker   Packs/day: 0.50    Types: Cigarettes    Last attempt to quit: 2018    Years since quitting: 2.0  . Smokeless tobacco: Never Used  Substance and Sexual Activity  . Alcohol use: Yes  . Drug use: No  . Sexual activity: Yes    Birth control/protection: Surgical  Lifestyle  . Physical activity:    Days per week: Not on file    Minutes per session: Not on file  . Stress: Not on file  Relationships  . Social connections:    Talks on phone: Not on file    Gets together: Not on file    Attends religious service: Not on file    Active member of club or organization: Not on file    Attends meetings of clubs or organizations: Not on file    Relationship status: Not on file  . Intimate partner violence:    Fear of current or ex partner: Not on file    Emotionally abused: Not on file    Physically abused: Not on file    Forced sexual activity: Not on file  Other Topics Concern  . Not on file  Social History Narrative  . Not on file     Allergies  Allergen Reactions  . Ibuprofen Itching    Itching w/  Rx 800mg ; able to take lower OTC doses without any problems     No outpatient medications prior to visit.   No facility-administered medications prior to visit.      Review of Systems  Constitutional: Positive for fatigue. Negative for diaphoresis and fever.  HENT: Positive for congestion.   Eyes: Negative.   Respiratory: Negative for cough, chest tightness, shortness of breath, wheezing and stridor.   Cardiovascular: Positive for leg swelling. Negative for chest pain and palpitations.  Endocrine: Positive for polyuria.  Genitourinary: Positive for frequency, menstrual problem, pelvic pain and vaginal bleeding. Negative for dysuria and hematuria.  Musculoskeletal: Negative.   Skin: Negative.   Neurological: Positive for dizziness. Negative for light-headedness.  Hematological: Bruises/bleeds easily.  Psychiatric/Behavioral: Negative for self-injury and suicidal ideas. The  patient is nervous/anxious.        Objective:   Physical Exam Vitals:   12/19/18 0836  BP: 134/79  Pulse: 77  Resp: 18  Temp: 98.6 F (37 C)  TempSrc: Oral  SpO2: 100%  Weight: 256 lb (116.1 kg)  Height: 5\' 3"  (1.6 m)    Gen: Pleasant, obese, in no distress,  normal affect  ENT: No lesions,  mouth clear,  oropharynx clear, no postnasal drip  Neck: No JVD, no TMG, no carotid bruits  Lungs: No use of accessory muscles, no dullness to percussion, clear without rales or rhonchi  Cardiovascular: RRR, heart sounds normal, no murmur or gallops, no peripheral edema  Abdomen: soft tender in the suprapubic area and mid abdominal area without palpable mass, no HSM,  BS normal  Musculoskeletal: No deformities, no cyanosis or clubbing  Neuro: alert, non focal  Skin: Warm, no lesions or rashes  No results found.  BMP Latest Ref Rng & Units 11/12/2018 04/07/2016 07/03/2015  Glucose 70 - 99 mg/dL 103(H) 120(H) 108(H)  BUN 6 - 20 mg/dL 6 <5(L) <5(L)  Creatinine 0.44 - 1.00 mg/dL 0.70 0.73 0.58  Sodium 135 - 145 mmol/L 137 135 135  Potassium 3.5 - 5.1 mmol/L 3.8 3.3(L) 3.4(L)  Chloride 98 - 111 mmol/L 104 107 106  CO2 22 - 32 mmol/L 23 20(L) 20(L)  Calcium 8.9 - 10.3 mg/dL 8.6(L) 8.9 8.9   Lab Results  Component Value Date   WBC 10.8 (H) 11/12/2018   HGB 8.1 (L) 11/12/2018   HCT 29.1 (L) 11/12/2018   MCV 64.5 (L) 11/12/2018   PLT 264 11/12/2018   Pelvic u/s 12/29 FINDINGS: Uterus  Measurements: 12.1 x 7.9 x 7.4 cm = volume: 367 mL. Probable large fibroid seen in the fundus measuring 7.9 x 7.5 x 6.6 cm.  Endometrium  Not visualized due to body habitus and previously described fibroid.  Right ovary  Not visualized due to body habitus.  Left ovary  Not visualized due to body habitus.  Other findings  No abnormal free fluid.  IMPRESSION: Limited examination due to body habitus. Ovaries are not visualized. Probable 7.9 cm fibroid is noted in the  uterine fundus.        Assessment & Plan:  I personally reviewed all images and lab data in the [facility] system as well as any outside material available during this office visit and agree with the  radiology impressions.   Symptomatic anemia Symptomatic anemia with associated iron deficiency anemia due to uterine fibroid with excess menorrhagia  We will check CBC today  We will also refer to gynecology for further evaluation of the uterine fibroids  Uterine fibroid Significant uterine fibroids seen on ultrasound is  likely source of bleeding  Refer to gynecology for same along with Pap smear  Polyuria We will check basic metabolic panel and urinalysis   Jordan Pruitt was seen today for hospitalization follow-up.  Diagnoses and all orders for this visit:  Intramural leiomyoma of uterus -     Ambulatory referral to Gynecology  Iron deficiency anemia due to chronic blood loss -     Ambulatory referral to Gynecology -     Basic metabolic panel; Future -     CBC with Differential/Platelet; Future -     POCT URINALYSIS DIP (CLINITEK) -     CBC with Differential/Platelet -     Basic metabolic panel  Menorrhagia with regular cycle -     Ambulatory referral to Gynecology  Polyuria -     POCT URINALYSIS DIP (CLINITEK)  Encounter for screening mammogram for breast cancer -     MM DIGITAL SCREENING BILATERAL; Future  Symptomatic anemia  Other orders -     ferrous sulfate 325 (65 FE) MG EC tablet; Take 1 tablet (325 mg total) by mouth 3 (three) times daily with meals.

## 2018-12-19 ENCOUNTER — Encounter: Payer: Self-pay | Admitting: Critical Care Medicine

## 2018-12-19 ENCOUNTER — Ambulatory Visit: Payer: Self-pay | Attending: Critical Care Medicine | Admitting: Critical Care Medicine

## 2018-12-19 VITALS — BP 134/79 | HR 77 | Temp 98.6°F | Resp 18 | Ht 63.0 in | Wt 256.0 lb

## 2018-12-19 DIAGNOSIS — Z8249 Family history of ischemic heart disease and other diseases of the circulatory system: Secondary | ICD-10-CM | POA: Insufficient documentation

## 2018-12-19 DIAGNOSIS — Z87891 Personal history of nicotine dependence: Secondary | ICD-10-CM | POA: Insufficient documentation

## 2018-12-19 DIAGNOSIS — N920 Excessive and frequent menstruation with regular cycle: Secondary | ICD-10-CM

## 2018-12-19 DIAGNOSIS — D649 Anemia, unspecified: Secondary | ICD-10-CM

## 2018-12-19 DIAGNOSIS — R358 Other polyuria: Secondary | ICD-10-CM | POA: Insufficient documentation

## 2018-12-19 DIAGNOSIS — R3589 Other polyuria: Secondary | ICD-10-CM | POA: Insufficient documentation

## 2018-12-19 DIAGNOSIS — Z833 Family history of diabetes mellitus: Secondary | ICD-10-CM | POA: Insufficient documentation

## 2018-12-19 DIAGNOSIS — D251 Intramural leiomyoma of uterus: Secondary | ICD-10-CM

## 2018-12-19 DIAGNOSIS — D500 Iron deficiency anemia secondary to blood loss (chronic): Secondary | ICD-10-CM

## 2018-12-19 DIAGNOSIS — Z886 Allergy status to analgesic agent status: Secondary | ICD-10-CM | POA: Insufficient documentation

## 2018-12-19 DIAGNOSIS — Z1231 Encounter for screening mammogram for malignant neoplasm of breast: Secondary | ICD-10-CM

## 2018-12-19 LAB — POCT URINALYSIS DIP (CLINITEK)
BILIRUBIN UA: NEGATIVE mg/dL
Bilirubin, UA: NEGATIVE
Blood, UA: NEGATIVE
Glucose, UA: NEGATIVE mg/dL
Leukocytes, UA: NEGATIVE
Nitrite, UA: NEGATIVE
PH UA: 6 (ref 5.0–8.0)
POC PROTEIN,UA: NEGATIVE
Spec Grav, UA: 1.025 (ref 1.010–1.025)
Urobilinogen, UA: 0.2 E.U./dL

## 2018-12-19 MED ORDER — FERROUS SULFATE 325 (65 FE) MG PO TBEC: 325.0000 mg | DELAYED_RELEASE_TABLET | Freq: Three times a day (TID) | ORAL | 3 refills | Status: AC

## 2018-12-19 NOTE — Assessment & Plan Note (Signed)
Symptomatic anemia with associated iron deficiency anemia due to uterine fibroid with excess menorrhagia  We will check CBC today  We will also refer to gynecology for further evaluation of the uterine fibroids

## 2018-12-19 NOTE — Assessment & Plan Note (Signed)
We will check basic metabolic panel and urinalysis

## 2018-12-19 NOTE — Patient Instructions (Signed)
A referral to gynecology will be made  Please make an appointment for financial counseling and paperwork for financial support  Laboratory today will include a urinalysis blood counts metabolic panel  A prescription for iron 325 mg was sent to our pharmacy to take 1 3 times daily with meals  An appointment for primary care to establish we may made within the next 4 to 6 weeks  Ask the gynecologist about the need for a mammogram and also for a Pap smear

## 2018-12-19 NOTE — Progress Notes (Signed)
Patient is aware of no concerns being noted on UA

## 2018-12-19 NOTE — Assessment & Plan Note (Signed)
Significant uterine fibroids seen on ultrasound is likely source of bleeding  Refer to gynecology for same along with Pap smear

## 2018-12-20 LAB — CBC WITH DIFFERENTIAL/PLATELET
BASOS ABS: 0 10*3/uL (ref 0.0–0.2)
Basos: 0 %
EOS (ABSOLUTE): 0.1 10*3/uL (ref 0.0–0.4)
Eos: 1 %
Hematocrit: 30.8 % — ABNORMAL LOW (ref 34.0–46.6)
Hemoglobin: 8.9 g/dL — ABNORMAL LOW (ref 11.1–15.9)
IMMATURE GRANULOCYTES: 0 %
Immature Grans (Abs): 0 10*3/uL (ref 0.0–0.1)
Lymphocytes Absolute: 1.5 10*3/uL (ref 0.7–3.1)
Lymphs: 28 %
MCH: 20.6 pg — ABNORMAL LOW (ref 26.6–33.0)
MCHC: 28.9 g/dL — ABNORMAL LOW (ref 31.5–35.7)
MCV: 71 fL — ABNORMAL LOW (ref 79–97)
Monocytes Absolute: 0.4 10*3/uL (ref 0.1–0.9)
Monocytes: 8 %
NEUTROS PCT: 63 %
Neutrophils Absolute: 3.3 10*3/uL (ref 1.4–7.0)
PLATELETS: 321 10*3/uL (ref 150–450)
RBC: 4.33 x10E6/uL (ref 3.77–5.28)
RDW: 24.8 % — ABNORMAL HIGH (ref 11.7–15.4)
WBC: 5.3 10*3/uL (ref 3.4–10.8)

## 2018-12-20 LAB — BASIC METABOLIC PANEL
BUN/Creatinine Ratio: 13 (ref 9–23)
BUN: 9 mg/dL (ref 6–24)
CO2: 20 mmol/L (ref 20–29)
Calcium: 9.1 mg/dL (ref 8.7–10.2)
Chloride: 103 mmol/L (ref 96–106)
Creatinine, Ser: 0.68 mg/dL (ref 0.57–1.00)
GFR calc Af Amer: 127 mL/min/{1.73_m2} (ref >59)
GFR calc non Af Amer: 110 mL/min/{1.73_m2} (ref >59)
Glucose: 77 mg/dL (ref 65–99)
Potassium: 4 mmol/L (ref 3.5–5.2)
SODIUM: 138 mmol/L (ref 134–144)

## 2018-12-22 ENCOUNTER — Ambulatory Visit: Payer: Self-pay | Attending: Family Medicine

## 2019-01-15 ENCOUNTER — Encounter: Payer: Self-pay | Admitting: Obstetrics & Gynecology

## 2019-01-15 ENCOUNTER — Ambulatory Visit (INDEPENDENT_AMBULATORY_CARE_PROVIDER_SITE_OTHER): Payer: Medicaid Other | Admitting: Obstetrics & Gynecology

## 2019-01-15 ENCOUNTER — Other Ambulatory Visit (HOSPITAL_COMMUNITY)
Admission: RE | Admit: 2019-01-15 | Discharge: 2019-01-15 | Disposition: A | Discharge status: Home / Self Care | Payer: Medicaid Other | Source: Ambulatory Visit | Attending: Obstetrics & Gynecology | Admitting: Obstetrics & Gynecology

## 2019-01-15 VITALS — BP 158/77 | HR 81 | Ht 63.0 in | Wt 257.8 lb

## 2019-01-15 DIAGNOSIS — Z0000 Encounter for general adult medical examination without abnormal findings: Secondary | ICD-10-CM

## 2019-01-15 DIAGNOSIS — D219 Benign neoplasm of connective and other soft tissue, unspecified: Secondary | ICD-10-CM

## 2019-01-15 DIAGNOSIS — Z01419 Encounter for gynecological examination (general) (routine) without abnormal findings: Secondary | ICD-10-CM | POA: Insufficient documentation

## 2019-01-15 DIAGNOSIS — N898 Other specified noninflammatory disorders of vagina: Secondary | ICD-10-CM

## 2019-01-15 MED ORDER — MISOPROSTOL 200 MCG PO TABS: ORAL_TABLET | ORAL | 0 refills | Status: DC

## 2019-01-15 NOTE — Progress Notes (Signed)
Subjective:    Jordan Pruitt is a 43 y.o. single P4 (60, 9, 59, and 59 yo kids, 6 grands) female who presents for an annual exam. She has a long h/o HEAVY bleeding, regular, bleeds for 7-8 days with clots. She has had transfusions x 2, most recent last month. Her HBG last month was 8.9 at the maximum. She is taking iron. Her CT and u/s both showed fibroids.  She has had a BTL.  The patient is sexually active. GYN screening history: last pap: was normal. The patient wears seatbelts: yes. The patient participates in regular exercise: no. Has the patient ever been transfused or tattooed?: yes. The patient reports that there is not domestic violence in her life.   Menstrual History: OB History    Gravida  5   Para  4   Term  3   Preterm  1   AB  1   Living  4     SAB  1   TAB      Ectopic      Multiple      Live Births  24           Menarche age: 37 Patient's last menstrual period was 12/29/2018 (exact date). Period Cycle (Days): 28 Period Duration (Days): 3-8 Period Pattern: Regular Menstrual Flow: Heavy(clots) Menstrual Control: Maxi pad Menstrual Control Change Freq (Hours): 1.5-2 Dysmenorrhea: (!) Severe Dysmenorrhea Symptoms: Cramping  The following portions of the patient's history were reviewed and updated as appropriate: allergies, current medications, past family history, past medical history, past social history, past surgical history and problem list.  Review of Systems Pertinent items are noted in HPI.   FH- no breast/gyn/colon cancer Works at [employer] Has had a BTL All Cesarean sections   Objective:    BP (!) 158/77   Pulse 81   Ht 5\' 3"  (1.6 m)   Wt 257 lb 12.8 oz (116.9 kg)   LMP 12/29/2018 (Exact Date)   BMI 45.67 kg/m   General Appearance:    Alert, cooperative, no distress, appears stated age  Head:    Normocephalic, without obvious abnormality, atraumatic  Eyes:    PERRL, conjunctiva/corneas clear, EOM's intact, fundi    benign, both  eyes  Ears:    Normal TM's and external ear canals, both ears  Nose:   Nares normal, septum midline, mucosa normal, no drainage    or sinus tenderness  Throat:   Lips, mucosa, and tongue normal; teeth and gums normal  Neck:   Supple, symmetrical, trachea midline, no adenopathy;    thyroid:  no enlargement/tenderness/nodules; no carotid   bruit or JVD  Back:     Symmetric, no curvature, ROM normal, no CVA tenderness  Lungs:     Clear to auscultation bilaterally, respirations unlabored  Chest Wall:    No tenderness or deformity   Heart:    Regular rate and rhythm, S1 and S2 normal, no murmur, rub   or gallop  Breast Exam:    No tenderness, masses, or nipple abnormality  Abdomen:     Soft, non-tender, bowel sounds active all four quadrants,    no masses, no organomegaly  Genitalia:    Normal female without lesion, discharge or tenderness, no masses with bimanual exam, exam limited by body habitus     Extremities:   Extremities normal, atraumatic, no cyanosis or edema  Pulses:   2+ and symmetric all extremities  Skin:   Skin color, texture, turgor normal, no rashes or lesions  Lymph nodes:   Cervical, supraclavicular, and axillary nodes normal  Neurologic:   CNII-XII intact, normal strength, sensation and reflexes    throughout  .    Assessment:    Healthy female exam.   fibroids, anemia, and menorrhagia Plan:     Thin prep Pap smear. with cotesting I discussed treatment options and she has opted for uterine artery embolization rec EMBX at next visit

## 2019-01-15 NOTE — Addendum Note (Signed)
Addended by: Dolores Hoose on: 01/15/2019 05:25 PM   Modules accepted: Orders

## 2019-01-16 ENCOUNTER — Other Ambulatory Visit: Payer: Self-pay | Admitting: Obstetrics & Gynecology

## 2019-01-16 DIAGNOSIS — D250 Submucous leiomyoma of uterus: Secondary | ICD-10-CM

## 2019-01-18 ENCOUNTER — Other Ambulatory Visit: Payer: Self-pay | Admitting: Obstetrics & Gynecology

## 2019-01-18 ENCOUNTER — Telehealth: Payer: Self-pay | Admitting: *Deleted

## 2019-01-18 ENCOUNTER — Ambulatory Visit
Admission: RE | Admit: 2019-01-18 | Discharge: 2019-01-18 | Disposition: A | Discharge status: Home / Self Care | Payer: Medicaid Other | Source: Ambulatory Visit | Attending: Obstetrics & Gynecology | Admitting: Obstetrics & Gynecology

## 2019-01-18 DIAGNOSIS — D250 Submucous leiomyoma of uterus: Secondary | ICD-10-CM

## 2019-01-18 HISTORY — PX: IR RADIOLOGIST EVAL & MGMT: IMG5224

## 2019-01-18 HISTORY — DX: Benign neoplasm of connective and other soft tissue, unspecified: D21.9

## 2019-01-18 LAB — CERVICOVAGINAL ANCILLARY ONLY
Bacterial vaginitis: POSITIVE — AB
CANDIDA VAGINITIS: NEGATIVE
TRICH (WINDOWPATH): NEGATIVE

## 2019-01-18 MED ORDER — METRONIDAZOLE 500 MG PO TABS: 500.0000 mg | ORAL_TABLET | Freq: Two times a day (BID) | ORAL | 0 refills | Status: DC

## 2019-01-18 NOTE — Progress Notes (Unsigned)
Flagyl fo rbv 

## 2019-01-18 NOTE — Consult Note (Signed)
Chief Complaint: Patient was seen in consultation today for uterine fibroid embolization at the request of [place] C  Referring Physician(s): Dove,Myra C  History of Present Illness: Jordan Pruitt is a 43 y.o. G5,P4 African American female with a history of severe menorrhagia who was admitted to [facility] on 11/12/2018 with shortness of breath, severe fatigue, chest pain and anemia with a hemoglobin of 6.0.  She received a transfusion of 2 units of packed red blood cells with increase in hemoglobin to 8.1.  Most recent hemoglobin on 12/19/2018 was 8.9.  Probable uterine fibroids were suggested on a CT in 2016.  Pelvic ultrasound on 11/12/2018 demonstrated uterine volume of approximately 367 mL with a dominant fundal fibroid measuring approximately 7.9 x 7.5 x 6.6 cm.  Neither ovary was able to be visualized by ultrasound.  The endometrium was also obscured due to shadowing from the dominant fibroid.  Jordan Pruitt has regular menstrual cycles every month with last menstrual period of 12/29/2018.  Her cycles usually last 7 to 8 days with 4 to 5 days of heavy bleeding and passage of clots.  She describes cramping with cycles.  She also has bulk symptoms of frequent urination, nocturia and generalized pelvic discomfort.  Her children are aged 77, 97, 47 and 12 years old and were all delivered by cesarean section.  She had one miscarriage between her second and third children.  She has had bilateral tubal ligation and has no plans for future pregnancy.  A Pap smear was performed on 01/15/2019 with results pending.  An endometrial biopsy is scheduled for 02/05/2019.  She has no history of gynecologic infections or prior fibroid surgery.  She has not been treated with hormonal therapy.  Past Medical History:  Diagnosis Date  . Anemia   . Fibroids   . Menorrhagia   . Obesity   . Paronychia     Past Surgical History:  Procedure Laterality Date  . CESAREAN SECTION     x4  . IR RADIOLOGIST EVAL &  MGMT  01/18/2019  . TUBAL LIGATION      Allergies: Ibuprofen  Medications: Prior to Admission medications   Medication Sig Start Date End Date Taking? Authorizing Provider  ferrous sulfate 325 (65 FE) MG EC tablet Take 1 tablet (325 mg total) by mouth 3 (three) times daily with meals. 12/19/18   Elsie Stain, MD  misoprostol (CYTOTEC) 200 MCG tablet Take 3 pills by mouth the night before biopsy. 01/15/19   Emily Filbert, MD  Multiple Vitamins-Calcium (ONE-A-DAY WOMENS FORMULA PO) Take by mouth.    [provider]     Family History  Problem Relation Age of Onset  . Hypertension Father   . Hypertension Mother   . Diabetes Mother   . Diabetes Maternal Aunt     Social History   Socioeconomic History  . Marital status: Single    Spouse name: Not on file  . Number of children: Not on file  . Years of education: Not on file  . Highest education level: Not on file  Occupational History  . Not on file  Social Needs  . Financial resource strain: Not on file  . Food insecurity:    Worry: Sometimes true    Inability: Sometimes true  . Transportation needs:    Medical: No    Non-medical: No  Tobacco Use  . Smoking status: Former Smoker    Packs/day: 0.50    Types: Cigarettes    Last attempt  to quit: 2018    Years since quitting: 2.1  . Smokeless tobacco: Never Used  Substance and Sexual Activity  . Alcohol use: Yes  . Drug use: No  . Sexual activity: Yes    Birth control/protection: Surgical  Lifestyle  . Physical activity:    Days per week: Not on file    Minutes per session: Not on file  . Stress: Not on file  Relationships  . Social connections:    Talks on phone: Not on file    Gets together: Not on file    Attends religious service: Not on file    Active member of club or organization: Not on file    Attends meetings of clubs or organizations: Not on file    Relationship status: Not on file  Other Topics Concern  . Not on file  Social History  Narrative  . Not on file     Review of Systems: A 12 point ROS discussed and pertinent positives are indicated in the HPI above.  All other systems are negative.  Review of Systems  Constitutional: Positive for fatigue. Negative for chills, diaphoresis and fever.  HENT: Negative.   Respiratory: Negative for choking, chest tightness, wheezing and stridor.        Some dyspnea with exertion.  Cardiovascular: Negative.   Gastrointestinal: Positive for abdominal distention and abdominal pain. Negative for blood in stool, constipation, diarrhea, nausea and vomiting.  Genitourinary: Positive for frequency, menstrual problem, pelvic pain and urgency. Negative for dysuria, flank pain and hematuria.  Musculoskeletal: Negative.   Neurological: Negative.     Vital Signs: BP (!) 152/82   Pulse 80   Temp 98.3 F (36.8 C) (Oral)   Resp 15   Ht '5\' 3"'  (1.6 m)   Wt 116.6 kg   LMP 12/29/2018 (Exact Date)   SpO2 100%   BMI 45.53 kg/m   Physical Exam Vitals signs reviewed.  Constitutional:      General: She is not in acute distress.    Appearance: She is obese. She is not ill-appearing, toxic-appearing or diaphoretic.  HENT:     Head: Normocephalic and atraumatic.  Neck:     Musculoskeletal: Neck supple. No muscular tenderness.  Cardiovascular:     Rate and Rhythm: Normal rate and regular rhythm.     Pulses: Normal pulses.     Heart sounds: Normal heart sounds. No murmur. No friction rub. No gallop.   Pulmonary:     Effort: Pulmonary effort is normal. No respiratory distress.     Breath sounds: Normal breath sounds. No stridor. No wheezing, rhonchi or rales.  Abdominal:     Palpations: Abdomen is soft.     Tenderness: There is no abdominal tenderness. There is no guarding or rebound.     Comments: With deep palpation, top of uterus is palpable on transabdominal exam.  Musculoskeletal:        General: No swelling.  Lymphadenopathy:     Cervical: No cervical adenopathy.  Skin:     General: Skin is warm and dry.  Neurological:     General: No focal deficit present.     Mental Status: She is alert and oriented to person, place, and time.     Imaging: Ir Radiologist Eval & Mgmt  Result Date: 01/18/2019 Please refer to notes tab for details about interventional procedure. (Op Note)   Labs:  CBC: Recent Labs    11/12/18 0056 11/12/18 0824 11/12/18 1548 12/19/18 0947  WBC 10.8*  --   --  5.3  HGB 6.0* 7.2* 8.1* 8.9*  HCT 23.3* 27.1* 29.1* 30.8*  PLT 264  --   --  321    COAGS: No results for input(s): INR, APTT in the last 8760 hours.  BMP: Recent Labs    11/12/18 0056 12/19/18 0947  NA 137 138  K 3.8 4.0  CL 104 103  CO2 23 20  GLUCOSE 103* 77  BUN 6 9  CALCIUM 8.6* 9.1  CREATININE 0.70 0.68  GFRNONAA >60 110  GFRAA >60 127     Assessment and Plan:  I met with Jordan Pruitt.  We reviewed treatment options for symptomatic uterine fibroids including hysterectomy and uterine fibroid embolization.  She clearly has an indication for treatment due to severe symptomatic anemia from menorrhagia requiring recent transfusion.  She previously discussed treatment options with Dr. Hulan Fray and requested embolization as she does not wish to miss much work.  I did tell Jordan Pruitt that candidacy for embolization would depend on results of the Pap smear, endometrial biopsy and an MRI of the pelvis which will be necessary to fully characterize fibroid morphology and enhancement.  This will also allow an opportunity to make sure there is no evidence of ovarian pathology as the ovaries were not visualized by ultrasound.  Details of uterine artery embolization were discussed with the patient including technical details, risks, outcomes and need for overnight observation after the procedure to treat post embolization symptoms.  I did tell most of that embolization is effective in decreasing menorrhagia but does not eliminate her menstrual cycles and at her age, there would be  a risk of potential development of new symptomatic fibroids prior to menopause.  The advantage of hysterectomy is that it would be more definitive in eliminating menstrual cycles and would more rapidly correct her anemia.   If she is a candidate, Jordan Pruitt would like to pursue uterine fibroid embolization.  We will await results of the recently performed Pap smear, endometrial biopsy later this month and MRI of the pelvis with and without gadolinium.  This MRI has been scheduled as an outpatient on 01/26/2019.  After it is performed and read, I will get back in touch with Jordan Pruitt regarding MRI results.  The authorization and scheduling process could begin if she is a candidate by MRI and if the endometrial biopsy and Pap smear show no evidence of malignancy or precancerous cells.  Thank you for this interesting consult.  I greatly enjoyed meeting Jordan Pruitt and look forward to participating in their care.  A copy of this report was sent to the requesting provider on this date.  Electronically Signed: Azzie Roup 01/18/2019, 9:56 AM   I spent a total of 40 Minutes in face to face in clinical consultation, greater than 50% of which was counseling/coordinating care for symptomatic uterine fibroids.

## 2019-01-18 NOTE — Telephone Encounter (Signed)
Called pt that she tested positive for BV.  Informed pt that this is not an STD. Informed pt that she had a prescription for Flagyl called into the [facility] on [address].  Advised pt not to consume alcohol while taking the medication.  Pt verbalized understanding.

## 2019-01-18 NOTE — Telephone Encounter (Signed)
-----   Message from Emily Filbert, MD sent at 01/18/2019 11:13 AM EST ----- Please let her know that I prescribed flagyl for BV seen on her pap.

## 2019-01-19 LAB — CYTOLOGY - PAP
Adequacy: ABSENT
Diagnosis: NEGATIVE
HPV 16/18/45 GENOTYPING: NEGATIVE
HPV: DETECTED — AB

## 2019-01-24 ENCOUNTER — Telehealth: Payer: Self-pay

## 2019-01-24 MED ORDER — METRONIDAZOLE 500 MG PO TABS: 500.0000 mg | ORAL_TABLET | Freq: Two times a day (BID) | ORAL | 0 refills | Status: AC

## 2019-01-24 NOTE — Telephone Encounter (Addendum)
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  Pt requested to have her Flagyl medication refilled because she never picked up original Rx.  Medication e-prescribed.

## 2019-01-25 ENCOUNTER — Ambulatory Visit: Payer: Medicaid Other

## 2019-01-25 ENCOUNTER — Other Ambulatory Visit: Payer: Self-pay

## 2019-01-26 ENCOUNTER — Ambulatory Visit (HOSPITAL_COMMUNITY)
Admission: RE | Admit: 2019-01-26 | Discharge: 2019-01-26 | Disposition: A | Discharge status: Home / Self Care | Payer: Self-pay | Source: Ambulatory Visit | Attending: Obstetrics & Gynecology | Admitting: Obstetrics & Gynecology

## 2019-01-26 DIAGNOSIS — D250 Submucous leiomyoma of uterus: Secondary | ICD-10-CM | POA: Insufficient documentation

## 2019-01-26 MED ORDER — GADOBUTROL 1 MMOL/ML IV SOLN
10.0000 mL | Freq: Once | INTRAVENOUS | Status: AC | PRN
  Administered 2019-01-26: 10 mL via INTRAVENOUS

## 2019-02-05 ENCOUNTER — Telehealth: Payer: Self-pay | Admitting: Obstetrics & Gynecology

## 2019-02-05 ENCOUNTER — Ambulatory Visit (INDEPENDENT_AMBULATORY_CARE_PROVIDER_SITE_OTHER): Payer: Self-pay | Admitting: Obstetrics & Gynecology

## 2019-02-05 ENCOUNTER — Other Ambulatory Visit: Payer: Self-pay

## 2019-02-05 ENCOUNTER — Other Ambulatory Visit (HOSPITAL_COMMUNITY)
Admission: RE | Admit: 2019-02-05 | Discharge: 2019-02-05 | Disposition: A | Discharge status: Home / Self Care | Payer: Medicaid Other | Source: Ambulatory Visit | Attending: Obstetrics & Gynecology | Admitting: Obstetrics & Gynecology

## 2019-02-05 ENCOUNTER — Encounter: Payer: Self-pay | Admitting: Obstetrics & Gynecology

## 2019-02-05 VITALS — BP 141/82 | HR 72 | Temp 98.4°F | Wt 262.0 lb

## 2019-02-05 DIAGNOSIS — N920 Excessive and frequent menstruation with regular cycle: Secondary | ICD-10-CM | POA: Insufficient documentation

## 2019-02-05 MED ORDER — ACETAMINOPHEN 500 MG PO TABS
1000.0000 mg | ORAL_TABLET | Freq: Once | ORAL | Status: AC
  Administered 2019-02-05: 1000 mg via ORAL

## 2019-02-05 NOTE — Telephone Encounter (Signed)
Called the patient to inform of COV19 restrictions. No visitors allowed with patient.

## 2019-02-05 NOTE — Progress Notes (Signed)
   Subjective:    Patient ID: Jordan Pruitt, female    DOB: 25-Apr-1976, 43 y.o.   MRN: [id number]  HPI 43 yo single P4 (all cesareans) here for a EMBX due to heavy bleeding. Her u/s showed fibroids. She took cytottec last night orally and is bleeding now.   Review of Systems     Objective:   Physical Exam Breathing, conversing, and ambulating normally Well nourished, well hydrated Black female, no apparent distress  UPT negative, consent signed, time out done Cervix prepped with betadine and grasped with a single tooth tenaculum Uterus sounded to 9 cm Pipelle used for 2 passes with a moderate amount of tissue obtained. She tolerated the procedure well.     Assessment & Plan:  Menorrhagia - await pathology I will call her with results through an evisit next week

## 2019-02-07 ENCOUNTER — Telehealth: Payer: Self-pay | Admitting: Obstetrics & Gynecology

## 2019-02-07 NOTE — Telephone Encounter (Signed)
I gave her the results of her benign EMBX. She will get scheduling a uterine artery embolization.

## 2019-02-09 ENCOUNTER — Encounter: Payer: Self-pay | Admitting: *Deleted

## 2019-02-14 ENCOUNTER — Ambulatory Visit (INDEPENDENT_AMBULATORY_CARE_PROVIDER_SITE_OTHER): Payer: Self-pay | Admitting: Obstetrics & Gynecology

## 2019-02-14 DIAGNOSIS — D219 Benign neoplasm of connective and other soft tissue, unspecified: Secondary | ICD-10-CM

## 2019-02-14 DIAGNOSIS — N920 Excessive and frequent menstruation with regular cycle: Secondary | ICD-10-CM

## 2019-02-14 MED ORDER — METRONIDAZOLE 500 MG PO TABS: 500.0000 mg | ORAL_TABLET | Freq: Two times a day (BID) | ORAL | 0 refills | Status: AC

## 2019-02-14 NOTE — Progress Notes (Signed)
Patient ID: Jordan Pruitt, female   DOB: 04/21/1976, 43 y.o.   MRN: [id number]   Jordan Pruitt with intervential radiology to get patient setup with appointment. Jocelyn Lamer informed that their office is temporarily closed and would not be able to see the patient until late May or June. She took patient's MRN and let me know that when the office reopens, that she would give the patient a call to get scheduled.

## 2019-02-14 NOTE — Progress Notes (Signed)
TELEHEALTH VIRTUAL GYNECOLOGY VISIT ENCOUNTER NOTE  I connected with [name] on 02/14/19 at  2:15 PM EDT by telephone at home and verified that I am speaking with the correct person using two identifiers.   I discussed the limitations, risks, security and privacy concerns of performing an evaluation and management service by telephone and the availability of in person appointments. I also discussed with the patient that there may be a patient responsible charge related to this service. The patient expressed understanding and agreed to proceed. She reports that the cramping has increased since her EMBX. She has tried BC powders, Midol, and tylenol. She is allergic to brand IBU but she can take the over the counter IBU.    History:  Jordan Pruitt is a 43 y.o. [phone] female being evaluated today for follow up of her menorrhagia and bacterial vaginosis. She tells me that Jordan Pruitt was on back order for the flagyl that I prescribed at her last visit. She denies any abnormal vaginal discharge, bleeding, pelvic pain or other concerns.   She saw the IR about uterine artery embolization. She will call them back and schedule this.   Past Medical History:  Diagnosis Date  . Anemia   . Fibroids   . Menorrhagia   . Obesity   . Paronychia    Past Surgical History:  Procedure Laterality Date  . CESAREAN SECTION     x4  . IR RADIOLOGIST EVAL & MGMT  01/18/2019  . TUBAL LIGATION     The following portions of the patient's history were reviewed and updated as appropriate: allergies, current medications, past family history, past medical history, past social history, past surgical history and problem list.   Health Maintenance:  Normal pap and positive HRHPV on 3/20.  Mammogram on  04/05/19.  Review of Systems:  Pertinent items noted in HPI and remainder of comprehensive ROS otherwise negative.  Physical Exam:  Physical exam deferred due to nature of the encounter  I told her that her [facility]  pathology was negative. Labs and Imaging No results found for this or any previous visit (from the past 336 hour(s)). Mr Pelvis W Wo Contrast  Result Date: 01/26/2019 CLINICAL DATA:  Symptomatic uterine fibroids. Preprocedural evaluation for uterine artery embolization. EXAM: MRI PELVIS WITHOUT AND WITH CONTRAST TECHNIQUE: Multiplanar multisequence MR imaging of the pelvis was performed both before and after administration of intravenous contrast. CONTRAST:  10 mL Gadavist COMPARISON:  CT on 07/03/2015 FINDINGS: Urinary Tract: No bladder or urethral abnormality identified. Bowel:  Unremarkable visualized pelvic bowel loops. Vascular/Lymphatic: No pathologically enlarged lymph nodes or other significant abnormality. Reproductive: -- Uterus: Measures 15.2 x 7.6 x 8.9 cm (volume = 540 cm^3). Four small uterine fibroids are seen. Largest is located in the right lateral corpus, measures 4.8 cm in maximum diameter, and has a partial submucosal component. -- Intracavitary fibroids:  None. -- Pedunculated fibroids: None. -- Fibroid contrast enhancement: All fibroids show contrast enhancement, without significant degeneration/devascularization. -- Right ovary:  Appears normal.  No mass identified. -- Left ovary:  Appears normal.  No mass identified. Other: No abnormal free fluid. Musculoskeletal:  Unremarkable. IMPRESSION: Several small uterine fibroids, largest measuring 4.8 cm. No intracavitary or pedunculated fibroids identified. Normal appearance of both ovaries.  No adnexal pathology. Electronically Signed   By: Earle Gell M.D.   On: 01/26/2019 11:25   Ir Radiologist Eval & Mgmt  Result Date: 01/18/2019 Please refer to notes tab for details about interventional procedure. (Op  Note)     Assessment and Plan:     There are no diagnoses linked to this encounter.     Menorrhagia with fibroids- for [place] Dysmenorrhea- rec OTC IBU (a total of 600 mg q 6 hours prn) Bacterial vaginosis- I have re- prescribed  flagyl and also rec'd boric acid supp per vagina every other night prn.  I discussed the assessment and treatment plan with the patient. The patient was provided an opportunity to ask questions and all were answered. The patient agreed with the plan and demonstrated an understanding of the instructions.   The patient was advised to call back or seek an in-person evaluation/go to the ED if the symptoms worsen or if the condition fails to improve as anticipated.  I provided 10 minutes of non-face-to-face time during this encounter.   Emily Filbert, MD [facility], [place]

## 2019-04-02 ENCOUNTER — Other Ambulatory Visit: Payer: Self-pay | Admitting: Obstetrics & Gynecology

## 2019-04-02 DIAGNOSIS — D250 Submucous leiomyoma of uterus: Secondary | ICD-10-CM

## 2019-04-05 ENCOUNTER — Ambulatory Visit
Admission: RE | Admit: 2019-04-05 | Discharge: 2019-04-05 | Disposition: A | Discharge status: Home / Self Care | Payer: Medicaid Other | Source: Ambulatory Visit | Attending: Critical Care Medicine | Admitting: Critical Care Medicine

## 2019-04-05 ENCOUNTER — Other Ambulatory Visit: Payer: Self-pay

## 2019-04-05 DIAGNOSIS — Z1231 Encounter for screening mammogram for malignant neoplasm of breast: Secondary | ICD-10-CM

## 2019-04-06 ENCOUNTER — Telehealth: Payer: Self-pay | Admitting: *Deleted

## 2019-04-06 NOTE — Telephone Encounter (Signed)
-----   Message from Elsie Stain, MD sent at 04/06/2019  9:02 AM EDT ----- pls let ms Gaetz know her mammogram was NORMAL

## 2019-04-06 NOTE — Telephone Encounter (Signed)
Medical Assistant left message on patient's home and cell voicemail. Voicemail states to give a call back to [place] with [facility] at [phone]. !!Mammogram was normal!!!

## 2019-04-10 ENCOUNTER — Other Ambulatory Visit: Payer: Medicaid Other

## 2019-04-11 ENCOUNTER — Telehealth: Payer: Self-pay | Admitting: Internal Medicine

## 2019-04-11 NOTE — Telephone Encounter (Signed)
Pt returned call for her results, and her results  Were given.

## 2019-05-10 ENCOUNTER — Other Ambulatory Visit (HOSPITAL_COMMUNITY): Payer: Self-pay | Admitting: Interventional Radiology

## 2019-05-10 DIAGNOSIS — D251 Intramural leiomyoma of uterus: Secondary | ICD-10-CM

## 2019-05-24 ENCOUNTER — Encounter (HOSPITAL_COMMUNITY): Payer: Self-pay | Admitting: Emergency Medicine

## 2019-05-24 ENCOUNTER — Ambulatory Visit (HOSPITAL_COMMUNITY)
Admission: EM | Admit: 2019-05-24 | Discharge: 2019-05-24 | Disposition: A | Discharge status: Home / Self Care | Payer: Self-pay | Attending: Emergency Medicine | Admitting: Emergency Medicine

## 2019-05-24 DIAGNOSIS — M79645 Pain in left finger(s): Secondary | ICD-10-CM

## 2019-05-24 MED ORDER — CEPHALEXIN 500 MG PO CAPS: 500.0000 mg | ORAL_CAPSULE | Freq: Two times a day (BID) | ORAL | 0 refills | Status: AC

## 2019-05-24 NOTE — ED Triage Notes (Signed)
Pt states 5 days ago she had her nails done and it feels like she got her nail punctured while doing her nails. Pt c/o L pointer fingernail pain and swelling. Pt states she has bruising underneath her nailbed.

## 2019-05-24 NOTE — Discharge Instructions (Signed)
Take antibiotic as prescribed. Supportive finish your antibiotics all the way to reduce risk of complications/secondary infection. Keep area clean and dry. Avoid nail salon's, wearing fake/acrylic nails to help keep natural nails healthy. Return if you develop worsening pain, redness, swelling, discharge.

## 2019-05-24 NOTE — ED Provider Notes (Signed)
[facility]    CSN: [id number] Arrival date & time: 05/24/19  1729     History   Chief Complaint Chief Complaint  Patient presents with  . Fingernail Pain    HPI NEERA TENG is a 43 y.o. female sending for left index fingernail pain and swelling.  Patient states that she had an incident 5 days ago and felt like her nail was punctured.  States that it burned when soaking "and went over solution the use ".  Patient endorsing pain, swelling.  Denies redness, discharge, decreased ROM, fever.  Is taking ibuprofen for pain which helps, though is endorsing more pain at nighttime.  Patient wears fake nails, though had these removed at her nail appointment.    Past Medical History:  Diagnosis Date  . Anemia   . Fibroids   . Menorrhagia   . Obesity   . Paronychia     Patient Active Problem List   Diagnosis Date Noted  . Menorrhagia with regular cycle 12/19/2018  . Polyuria 12/19/2018  . Symptomatic anemia 11/12/2018  . Uterine fibroid 11/12/2018    Past Surgical History:  Procedure Laterality Date  . CESAREAN SECTION     x4  . IR RADIOLOGIST EVAL & MGMT  01/18/2019  . TUBAL LIGATION      OB History    Gravida  5   Para  4   Term  3   Preterm  1   AB  1   Living  4     SAB  1   TAB      Ectopic      Multiple      Live Births  4            Home Medications    Prior to Admission medications   Medication Sig Start Date End Date Taking? Authorizing Provider  cephALEXin (KEFLEX) 500 MG capsule Take 1 capsule (500 mg total) by mouth 2 (two) times daily for 7 days. 05/24/19 05/31/19  Hall-Potvin, [place], PA-C  ferrous sulfate 325 (65 FE) MG EC tablet Take 1 tablet (325 mg total) by mouth 3 (three) times daily with meals. 12/19/18   Elsie Stain, MD  Multiple Vitamins-Calcium (ONE-A-DAY WOMENS FORMULA PO) Take by mouth.    [provider]    Family History Family History  Problem Relation Age of Onset  . Hypertension Father    . Hypertension Mother   . Diabetes Mother   . Diabetes Maternal Aunt     Social History Social History   Tobacco Use  . Smoking status: Former Smoker    Packs/day: 0.50    Types: Cigarettes    Quit date: 2018    Years since quitting: 2.5  . Smokeless tobacco: Never Used  Substance Use Topics  . Alcohol use: Yes  . Drug use: No     Allergies   Ibuprofen   Review of Systems As per HPI   Physical Exam Triage Vital Signs ED Triage Vitals  Enc Vitals Group     BP 05/24/19 1848 139/78     Pulse Rate 05/24/19 1848 80     Resp 05/24/19 1848 16     Temp 05/24/19 1848 97.8 F (36.6 C)     Temp src --      SpO2 05/24/19 1848 100 %     Weight --      Height --      Head Circumference --      Peak Flow --  Pain Score 05/24/19 1850 6     Pain Loc --      Pain Edu? --      Excl. in [place]? --    No data found.  Updated Vital Signs BP 139/78   Pulse 80   Temp 97.8 F (36.6 C)   Resp 16   SpO2 100%   Visual Acuity Right Eye Distance:   Left Eye Distance:   Bilateral Distance:    Right Eye Near:   Left Eye Near:    Bilateral Near:     Physical Exam Constitutional:      General: She is not in acute distress. HENT:     Head: Normocephalic and atraumatic.  Eyes:     General: No scleral icterus.    Pupils: Pupils are equal, round, and reactive to light.  Cardiovascular:     Rate and Rhythm: Normal rate.  Pulmonary:     Effort: Pulmonary effort is normal.  Musculoskeletal:     Comments: Left first digit with moderate amount of edema on distal aspect, around nailbed.  No erythema, warmth, fluctuance.  No open wound or discharge.  Full range of motion, the patient does endorse pain with full DIP flexion of left first finger.  DIP distal aspect of finger is exquisitely tender to palpation.  NVI  Skin:    Comments: Fingernails intact and without laceration, damage or obvious deformity.  Neurological:     Mental Status: She is alert and oriented to person,  place, and time.      UC Treatments / Results  Labs (all labs ordered are listed, but only abnormal results are displayed) Labs Reviewed - No data to display  EKG   Radiology No results found.  Procedures Procedures (including critical care time)  Medications Ordered in UC Medications - No data to display  Initial Impression / Assessment and Plan / UC Course  I have reviewed the triage vital signs and the nursing notes.  Pertinent labs & imaging results that were available during my care of the patient were reviewed by me and considered in my medical decision making (see chart for details).     43 year old female presenting for left first finger pain after going to nail salon.  History and exam is concerning for forming paronychia, though no discernible area for I&D.  Will treat with antibiotics at this time.  Return precautions discussed, patient verbalized understanding and is agreeable to plan. Final Clinical Impressions(s) / UC Diagnoses   Final diagnoses:  Finger pain, left     Discharge Instructions     Take antibiotic as prescribed. Supportive finish your antibiotics all the way to reduce risk of complications/secondary infection. Keep area clean and dry. Avoid nail salon's, wearing fake/acrylic nails to help keep natural nails healthy. Return if you develop worsening pain, redness, swelling, discharge.    ED Prescriptions    Medication Sig Dispense Auth. Provider   cephALEXin (KEFLEX) 500 MG capsule Take 1 capsule (500 mg total) by mouth 2 (two) times daily for 7 days. 14 capsule Hall-Potvin, [place], PA-C     Controlled Substance Prescriptions [place] Controlled Substance Registry consulted? Not Applicable   Quincy Sheehan, [place] 05/24/19 1920

## 2019-05-27 ENCOUNTER — Other Ambulatory Visit: Payer: Self-pay

## 2019-05-27 ENCOUNTER — Encounter (HOSPITAL_COMMUNITY): Payer: Self-pay | Admitting: Physician Assistant

## 2019-05-27 ENCOUNTER — Ambulatory Visit (HOSPITAL_COMMUNITY)
Admission: EM | Admit: 2019-05-27 | Discharge: 2019-05-27 | Disposition: A | Discharge status: Home / Self Care | Payer: No Typology Code available for payment source | Attending: Physician Assistant | Admitting: Physician Assistant

## 2019-05-27 DIAGNOSIS — L089 Local infection of the skin and subcutaneous tissue, unspecified: Secondary | ICD-10-CM

## 2019-05-27 MED ORDER — HYDROCODONE-ACETAMINOPHEN 5-325 MG PO TABS: 1.0000 | ORAL_TABLET | Freq: Four times a day (QID) | ORAL | 0 refills | Status: DC | PRN

## 2019-05-27 MED ORDER — DOXYCYCLINE HYCLATE 100 MG PO CAPS: 100.0000 mg | ORAL_CAPSULE | Freq: Two times a day (BID) | ORAL | 0 refills | Status: DC

## 2019-05-27 NOTE — ED Triage Notes (Signed)
Provider triage patient.

## 2019-05-27 NOTE — Discharge Instructions (Signed)
Start doxycycline as directed. Continue keflex as directed. You can continue ibuprofen 600mg  4 times a day (every 6 hours) or 800mg  three times a day (every 8 hours). You can supplement with tylenol 650-1000mg  three times a day for pain. You can take norco at night if needed. As discussed norco has 325mg  of tylenol, this will not put you over tylenol daily limit if you are only taking norco at night. Follow up with hand orthopedic in 2-3 days for reevaluation. If experiencing worsening symptoms prior to seeing hand orthopedics, go to the ED for further evaluation needed.

## 2019-05-27 NOTE — ED Provider Notes (Signed)
[facility]    CSN: [id number] Arrival date & time: 05/27/19  1007     History   Chief Complaint Chief Complaint  Patient presents with  . Hand Pain    Finger    HPI Jordan Pruitt is a 43 y.o. female.   43 year old female returns to clinic after being seen 05/24/2019 for left index finger pain. At that time, area was worrisome for developing paronychia and keflex was provided. Patient states has been taking as directed with some relief. However, has had more swelling to the finger pad and increased pressure/throbbing. Denies spreading erythema, warmth, fever. Has decreased ROM of DIP joint due to the pain and swelling. Has been taking ibuprofen with some relief.      Past Medical History:  Diagnosis Date  . Anemia   . Fibroids   . Menorrhagia   . Obesity   . Paronychia     Patient Active Problem List   Diagnosis Date Noted  . Menorrhagia with regular cycle 12/19/2018  . Polyuria 12/19/2018  . Symptomatic anemia 11/12/2018  . Uterine fibroid 11/12/2018    Past Surgical History:  Procedure Laterality Date  . CESAREAN SECTION     x4  . IR RADIOLOGIST EVAL & MGMT  01/18/2019  . TUBAL LIGATION      OB History    Gravida  5   Para  4   Term  3   Preterm  1   AB  1   Living  4     SAB  1   TAB      Ectopic      Multiple      Live Births  4            Home Medications    Prior to Admission medications   Medication Sig Start Date End Date Taking? Authorizing Provider  cephALEXin (KEFLEX) 500 MG capsule Take 1 capsule (500 mg total) by mouth 2 (two) times daily for 7 days. 05/24/19 05/31/19  Hall-Potvin, [place], PA-C  doxycycline (VIBRAMYCIN) 100 MG capsule Take 1 capsule (100 mg total) by mouth 2 (two) times daily. 05/27/19   Tasia Catchings, Amy V, PA-C  ferrous sulfate 325 (65 FE) MG EC tablet Take 1 tablet (325 mg total) by mouth 3 (three) times daily with meals. 12/19/18   Elsie Stain, MD  HYDROcodone-acetaminophen  (NORCO/VICODIN) 5-325 MG tablet Take 1 tablet by mouth every 6 (six) hours as needed. 05/27/19   Tasia Catchings, Amy V, PA-C  Multiple Vitamins-Calcium (ONE-A-DAY WOMENS FORMULA PO) Take by mouth.    [provider]    Family History Family History  Problem Relation Age of Onset  . Hypertension Father   . Hypertension Mother   . Diabetes Mother   . Diabetes Maternal Aunt     Social History Social History   Tobacco Use  . Smoking status: Former Smoker    Packs/day: 0.50    Types: Cigarettes    Quit date: 2018    Years since quitting: 2.5  . Smokeless tobacco: Never Used  Substance Use Topics  . Alcohol use: Yes  . Drug use: No     Allergies   Ibuprofen   Review of Systems Review of Systems  Reason unable to perform ROS: See HPI as above.     Physical Exam Triage Vital Signs ED Triage Vitals  Enc Vitals Group     BP      Pulse  Resp      Temp      Temp src      SpO2      Weight      Height      Head Circumference      Peak Flow      Pain Score      Pain Loc      Pain Edu?      Excl. in [place]?    No data found.  Updated Vital Signs BP 136/90   Pulse 74   Temp 98.2 F (36.8 C) (Oral)   Resp 16   SpO2 100%   Physical Exam Constitutional:      General: She is not in acute distress.    Appearance: She is well-developed. She is not diaphoretic.  HENT:     Head: Normocephalic and atraumatic.  Eyes:     Conjunctiva/sclera: Conjunctivae normal.     Pupils: Pupils are equal, round, and reactive to light.  Musculoskeletal:     Comments: Swelling to the left index finger pad. No erythema, warmth. Mild tenderness to palpation of finger pad, soft. No fluctuance felt. Decreased ROM of finger. Sensation intact. Cap refill <2s  Neurological:     Mental Status: She is alert and oriented to person, place, and time.    UC Treatments / Results  Labs (all labs ordered are listed, but only abnormal results are displayed) Labs Reviewed - No data to display   EKG   Radiology No results found.  Procedures Procedures (including critical care time)  Medications Ordered in UC Medications - No data to display  Initial Impression / Assessment and Plan / UC Course  I have reviewed the triage vital signs and the nursing notes.  Pertinent labs & imaging results that were available during my care of the patient were reviewed by me and considered in my medical decision making (see chart for details).    Exam worrisome for developing felon. However, finger pad soft to palpation without tightness/flucutance, induration. Will have patient continue keflex and add doxycycline to regimen. Will add norco for symptomatic treatment if needed. Strict return precautions given. Patient to follow up with hand orthopedic in 2-3 days for reevaluation if symptoms not improving.  If patient unable to make appointment with hand orthopedics, OK to call office and have office contact me for assistance on making appointment.  Final Clinical Impressions(s) / UC Diagnoses   Final diagnoses:  Finger infection    ED Prescriptions    Medication Sig Dispense Auth. Provider   doxycycline (VIBRAMYCIN) 100 MG capsule Take 1 capsule (100 mg total) by mouth 2 (two) times daily. 20 capsule Yu, Amy V, PA-C   HYDROcodone-acetaminophen (NORCO/VICODIN) 5-325 MG tablet Take 1 tablet by mouth every 6 (six) hours as needed. 10 tablet Ok Edwards, PA-C     Controlled Substance Prescriptions [place] Controlled Substance Registry consulted? Yes, I have consulted the [place] Controlled Substances Registry for this patient, and feel the risk/benefit ratio today is favorable for proceeding with this prescription for a controlled substance.   Ok Edwards, PA-C 05/27/19 1607

## 2019-06-14 ENCOUNTER — Other Ambulatory Visit (HOSPITAL_COMMUNITY)
Admission: RE | Admit: 2019-06-14 | Discharge: 2019-06-14 | Disposition: A | Discharge status: Home / Self Care | Payer: HRSA Program | Source: Ambulatory Visit | Attending: Interventional Radiology | Admitting: Interventional Radiology

## 2019-06-14 DIAGNOSIS — Z20828 Contact with and (suspected) exposure to other viral communicable diseases: Secondary | ICD-10-CM | POA: Insufficient documentation

## 2019-06-14 LAB — SARS CORONAVIRUS 2 (TAT 6-24 HRS): SARS Coronavirus 2: NEGATIVE

## 2019-06-17 ENCOUNTER — Other Ambulatory Visit: Payer: Self-pay | Admitting: Radiology

## 2019-06-18 ENCOUNTER — Ambulatory Visit (HOSPITAL_COMMUNITY)
Admission: RE | Admit: 2019-06-18 | Discharge: 2019-06-18 | Disposition: A | Discharge status: Home / Self Care | Payer: Medicaid Other | Source: Ambulatory Visit | Attending: Interventional Radiology | Admitting: Interventional Radiology

## 2019-06-18 ENCOUNTER — Other Ambulatory Visit: Payer: Self-pay

## 2019-06-18 ENCOUNTER — Encounter (HOSPITAL_COMMUNITY): Payer: Self-pay

## 2019-06-18 ENCOUNTER — Observation Stay (HOSPITAL_COMMUNITY)
Admission: RE | Admit: 2019-06-18 | Discharge: 2019-06-19 | Disposition: A | Discharge status: Home / Self Care | Payer: Medicaid Other | Source: Ambulatory Visit | Attending: Interventional Radiology | Admitting: Interventional Radiology

## 2019-06-18 ENCOUNTER — Other Ambulatory Visit: Payer: Self-pay | Admitting: Radiology

## 2019-06-18 DIAGNOSIS — Z6841 Body Mass Index (BMI) 40.0 and over, adult: Secondary | ICD-10-CM | POA: Insufficient documentation

## 2019-06-18 DIAGNOSIS — D251 Intramural leiomyoma of uterus: Secondary | ICD-10-CM

## 2019-06-18 DIAGNOSIS — Z886 Allergy status to analgesic agent status: Secondary | ICD-10-CM | POA: Insufficient documentation

## 2019-06-18 DIAGNOSIS — D649 Anemia, unspecified: Secondary | ICD-10-CM | POA: Insufficient documentation

## 2019-06-18 DIAGNOSIS — D259 Leiomyoma of uterus, unspecified: Principal | ICD-10-CM | POA: Insufficient documentation

## 2019-06-18 DIAGNOSIS — E669 Obesity, unspecified: Secondary | ICD-10-CM | POA: Insufficient documentation

## 2019-06-18 DIAGNOSIS — Z79899 Other long term (current) drug therapy: Secondary | ICD-10-CM | POA: Insufficient documentation

## 2019-06-18 HISTORY — PX: IR US GUIDE VASC ACCESS RIGHT: IMG2390

## 2019-06-18 HISTORY — PX: IR ANGIOGRAM PELVIS SELECTIVE OR SUPRASELECTIVE: IMG661

## 2019-06-18 HISTORY — PX: IR ANGIOGRAM SELECTIVE EACH ADDITIONAL VESSEL: IMG667

## 2019-06-18 HISTORY — PX: IR EMBO TUMOR ORGAN ISCHEMIA INFARCT INC GUIDE ROADMAPPING: IMG5449

## 2019-06-18 LAB — CBC WITH DIFFERENTIAL/PLATELET
Abs Immature Granulocytes: 0.01 10*3/uL (ref 0.00–0.07)
Basophils Absolute: 0 10*3/uL (ref 0.0–0.1)
Basophils Relative: 0 %
Eosinophils Absolute: 0.1 10*3/uL (ref 0.0–0.5)
Eosinophils Relative: 2 %
HCT: 34.7 % — ABNORMAL LOW (ref 36.0–46.0)
Hemoglobin: 10 g/dL — ABNORMAL LOW (ref 12.0–15.0)
Immature Granulocytes: 0 %
Lymphocytes Relative: 36 %
Lymphs Abs: 1.3 10*3/uL (ref 0.7–4.0)
MCH: 22.8 pg — ABNORMAL LOW (ref 26.0–34.0)
MCHC: 28.8 g/dL — ABNORMAL LOW (ref 30.0–36.0)
MCV: 79.2 fL — ABNORMAL LOW (ref 80.0–100.0)
Monocytes Absolute: 0.3 10*3/uL (ref 0.1–1.0)
Monocytes Relative: 9 %
Neutro Abs: 1.9 10*3/uL (ref 1.7–7.7)
Neutrophils Relative %: 53 %
Platelets: 265 10*3/uL (ref 150–400)
RBC: 4.38 MIL/uL (ref 3.87–5.11)
RDW: 24.4 % — ABNORMAL HIGH (ref 11.5–15.5)
WBC: 3.6 10*3/uL — ABNORMAL LOW (ref 4.0–10.5)
nRBC: 0 % (ref 0.0–0.2)

## 2019-06-18 LAB — BASIC METABOLIC PANEL
Anion gap: 12 (ref 5–15)
BUN: 9 mg/dL (ref 6–20)
CO2: 22 mmol/L (ref 22–32)
Calcium: 8.9 mg/dL (ref 8.9–10.3)
Chloride: 106 mmol/L (ref 98–111)
Creatinine, Ser: 0.68 mg/dL (ref 0.44–1.00)
GFR calc Af Amer: >60 mL/min (ref >60)
GFR calc non Af Amer: >60 mL/min (ref >60)
Glucose, Bld: 91 mg/dL (ref 70–99)
Potassium: 3.9 mmol/L (ref 3.5–5.1)
Sodium: 140 mmol/L (ref 135–145)

## 2019-06-18 LAB — HCG, SERUM, QUALITATIVE: Preg, Serum: NEGATIVE

## 2019-06-18 LAB — PROTIME-INR
INR: 0.9 (ref 0.8–1.2)
Prothrombin Time: 12.2 seconds (ref 11.4–15.2)

## 2019-06-18 MED ORDER — FENTANYL CITRATE (PF) 100 MCG/2ML IJ SOLN
INTRAMUSCULAR | Status: AC
  Filled 2019-06-18: qty 4

## 2019-06-18 MED ORDER — ONDANSETRON HCL 4 MG/2ML IJ SOLN
4.0000 mg | Freq: Four times a day (QID) | INTRAMUSCULAR | Status: DC | PRN
  Administered 2019-06-18: 18:00:00 4 mg via INTRAVENOUS
  Filled 2019-06-18: qty 2

## 2019-06-18 MED ORDER — SODIUM CHLORIDE 0.9 % IV SOLN
INTRAVENOUS | Status: DC
  Administered 2019-06-18: 13:00:00 via INTRAVENOUS

## 2019-06-18 MED ORDER — IOHEXOL 300 MG/ML  SOLN
100.0000 mL | Freq: Once | INTRAMUSCULAR | Status: AC | PRN
  Administered 2019-06-18: 21 mL via INTRA_ARTERIAL

## 2019-06-18 MED ORDER — DEXAMETHASONE SODIUM PHOSPHATE 10 MG/ML IJ SOLN
8.0000 mg | Freq: Once | INTRAMUSCULAR | Status: AC
  Administered 2019-06-18: 8 mg via INTRAVENOUS

## 2019-06-18 MED ORDER — HYDROMORPHONE 1 MG/ML IV SOLN
INTRAVENOUS | Status: DC
  Administered 2019-06-18: 30 mg via INTRAVENOUS
  Administered 2019-06-18: 1.2 mg via INTRAVENOUS
  Administered 2019-06-18: 3.3 mg via INTRAVENOUS
  Administered 2019-06-19 (×2): 1.5 mg via INTRAVENOUS
  Filled 2019-06-18 (×10): qty 30

## 2019-06-18 MED ORDER — LIDOCAINE HCL (PF) 1 % IJ SOLN
INTRAMUSCULAR | Status: AC | PRN
  Administered 2019-06-18: 5 mL

## 2019-06-18 MED ORDER — HYDROMORPHONE HCL 1 MG/ML IJ SOLN
INTRAMUSCULAR | Status: AC
  Filled 2019-06-18: qty 1

## 2019-06-18 MED ORDER — NALOXONE HCL 0.4 MG/ML IJ SOLN: 0.4000 mg | INTRAMUSCULAR | Status: DC | PRN

## 2019-06-18 MED ORDER — LIDOCAINE HCL 1 % IJ SOLN
INTRAMUSCULAR | Status: AC
  Filled 2019-06-18: qty 20

## 2019-06-18 MED ORDER — IOHEXOL 300 MG/ML  SOLN
100.0000 mL | Freq: Once | INTRAMUSCULAR | Status: AC | PRN
  Administered 2019-06-18: 17:00:00 8 mL via INTRA_ARTERIAL

## 2019-06-18 MED ORDER — IOHEXOL 300 MG/ML  SOLN
100.0000 mL | Freq: Once | INTRAMUSCULAR | Status: AC | PRN
  Administered 2019-06-18: 50 mL via INTRA_ARTERIAL

## 2019-06-18 MED ORDER — DEXAMETHASONE SODIUM PHOSPHATE 10 MG/ML IJ SOLN
INTRAMUSCULAR | Status: AC
  Filled 2019-06-18: qty 1

## 2019-06-18 MED ORDER — SODIUM CHLORIDE 0.9% FLUSH
3.0000 mL | Freq: Two times a day (BID) | INTRAVENOUS | Status: DC
  Administered 2019-06-18 – 2019-06-19 (×2): 3 mL via INTRAVENOUS

## 2019-06-18 MED ORDER — SODIUM CHLORIDE 0.9% FLUSH: 3.0000 mL | INTRAVENOUS | Status: DC | PRN

## 2019-06-18 MED ORDER — MIDAZOLAM HCL 2 MG/2ML IJ SOLN
INTRAMUSCULAR | Status: AC | PRN
  Administered 2019-06-18 (×6): 1 mg via INTRAVENOUS

## 2019-06-18 MED ORDER — DIPHENHYDRAMINE HCL 50 MG/ML IJ SOLN: 12.5000 mg | Freq: Four times a day (QID) | INTRAMUSCULAR | Status: DC | PRN

## 2019-06-18 MED ORDER — ONDANSETRON HCL 4 MG/2ML IJ SOLN
INTRAMUSCULAR | Status: AC | PRN
  Administered 2019-06-18: 4 mg via INTRAVENOUS

## 2019-06-18 MED ORDER — DOCUSATE SODIUM 100 MG PO CAPS
100.0000 mg | ORAL_CAPSULE | Freq: Two times a day (BID) | ORAL | Status: DC
  Administered 2019-06-18 – 2019-06-19 (×2): 100 mg via ORAL
  Filled 2019-06-18 (×2): qty 1

## 2019-06-18 MED ORDER — SODIUM CHLORIDE 0.9 % IV SOLN
INTRAVENOUS | Status: DC
  Administered 2019-06-18: 20:00:00 via INTRAVENOUS

## 2019-06-18 MED ORDER — ONDANSETRON HCL 4 MG/2ML IJ SOLN: 4.0000 mg | Freq: Four times a day (QID) | INTRAMUSCULAR | Status: DC | PRN

## 2019-06-18 MED ORDER — PROMETHAZINE HCL 25 MG PO TABS
25.0000 mg | ORAL_TABLET | Freq: Three times a day (TID) | ORAL | Status: DC | PRN
  Administered 2019-06-18: 25 mg via ORAL
  Filled 2019-06-18 (×2): qty 1

## 2019-06-18 MED ORDER — DIPHENHYDRAMINE HCL 12.5 MG/5ML PO ELIX
12.5000 mg | ORAL_SOLUTION | Freq: Four times a day (QID) | ORAL | Status: DC | PRN
  Filled 2019-06-18: qty 5

## 2019-06-18 MED ORDER — CEFAZOLIN SODIUM-DEXTROSE 2-4 GM/100ML-% IV SOLN
2.0000 g | INTRAVENOUS | Status: AC
  Administered 2019-06-18: 2 g via INTRAVENOUS

## 2019-06-18 MED ORDER — FENTANYL CITRATE (PF) 100 MCG/2ML IJ SOLN
INTRAMUSCULAR | Status: AC | PRN
  Administered 2019-06-18 (×6): 50 ug via INTRAVENOUS

## 2019-06-18 MED ORDER — HYDROMORPHONE HCL 1 MG/ML IJ SOLN
INTRAMUSCULAR | Status: AC | PRN
  Administered 2019-06-18: 1 mg via INTRAVENOUS

## 2019-06-18 MED ORDER — SODIUM CHLORIDE 0.9% FLUSH: 9.0000 mL | INTRAVENOUS | Status: DC | PRN

## 2019-06-18 MED ORDER — SODIUM CHLORIDE 0.9 % IV SOLN: 250.0000 mL | INTRAVENOUS | Status: DC | PRN

## 2019-06-18 MED ORDER — FERROUS SULFATE 325 (65 FE) MG PO TABS
325.0000 mg | ORAL_TABLET | Freq: Three times a day (TID) | ORAL | Status: DC
  Filled 2019-06-18 (×2): qty 1

## 2019-06-18 MED ORDER — CEFAZOLIN SODIUM-DEXTROSE 2-4 GM/100ML-% IV SOLN
INTRAVENOUS | Status: AC
  Administered 2019-06-18: 2 g via INTRAVENOUS
  Filled 2019-06-18: qty 100

## 2019-06-18 MED ORDER — PROMETHAZINE HCL 25 MG RE SUPP
25.0000 mg | Freq: Three times a day (TID) | RECTAL | Status: DC | PRN
  Filled 2019-06-18: qty 1

## 2019-06-18 MED ORDER — MIDAZOLAM HCL 2 MG/2ML IJ SOLN
INTRAMUSCULAR | Status: AC
  Filled 2019-06-18: qty 6

## 2019-06-18 MED ORDER — ONDANSETRON HCL 4 MG/2ML IJ SOLN
INTRAMUSCULAR | Status: AC
  Administered 2019-06-18: 4 mg via INTRAVENOUS
  Filled 2019-06-18: qty 2

## 2019-06-18 MED ORDER — FENTANYL CITRATE (PF) 100 MCG/2ML IJ SOLN
INTRAMUSCULAR | Status: AC
  Filled 2019-06-18: qty 2

## 2019-06-18 MED ORDER — PREDNISONE 20 MG PO TABS
20.0000 mg | ORAL_TABLET | Freq: Two times a day (BID) | ORAL | Status: AC
  Administered 2019-06-19: 20 mg via ORAL
  Filled 2019-06-18: qty 1

## 2019-06-18 NOTE — Procedures (Signed)
Interventional Radiology Procedure Note  Procedure: Uterine fibroid embolization  Complications: None  Estimated Blood Loss: < 10 mL  Findings: Bilateral uterine fibroid embolization performed via right femoral artery access. 5 Fr sheath.  Right uterine artery embolized with 1 and 2/3 vials 500-700 micron sized Embospheres.  Left uterine artery embolized with 1 and 1/3 vials 500-700 micron sized Embospheres.   Venetia Night. Kathlene Cote, M.D Pager:  [phone]

## 2019-06-18 NOTE — H&P (Signed)
Referring Physician(s): Dove,M  Supervising Physician: Aletta Edouard  Patient Status:  [facility] OP TBA  Chief Complaint: Symptomatic uterine fibroids   Subjective: Patient familiar to IR service from consultation with Dr. Kathlene Cote on 01/18/2019 to discuss treatment options for symptomatic uterine fibroids.  She was deemed an appropriate candidate for bilateral uterine artery embolization and presents today for the procedure.  She currently denies fever, headache, chest pain, dyspnea, cough, abdominal/back pain, nausea, vomiting , hematuria/dysuria or blood in stool.  She does have heavy menstrual cycles.  Past Medical History:  Diagnosis Date  . Anemia   . Fibroids   . Menorrhagia   . Obesity   . Paronychia       Allergies: Ibuprofen  Medications: Prior to Admission medications   Medication Sig Start Date End Date Taking? Authorizing Provider  doxycycline (VIBRAMYCIN) 100 MG capsule Take 1 capsule (100 mg total) by mouth 2 (two) times daily. 05/27/19   Tasia Catchings, Amy V, PA-C  ferrous sulfate 325 (65 FE) MG EC tablet Take 1 tablet (325 mg total) by mouth 3 (three) times daily with meals. 12/19/18   Elsie Stain, MD  HYDROcodone-acetaminophen (NORCO/VICODIN) 5-325 MG tablet Take 1 tablet by mouth every 6 (six) hours as needed. 05/27/19   Tasia Catchings, Amy V, PA-C  Multiple Vitamins-Calcium (ONE-A-DAY WOMENS FORMULA PO) Take by mouth.    [provider]     Vital Signs: BP (!) 163/81   Pulse 84   Temp 98.7 F (37.1 C) (Oral)   Resp 18   SpO2 100%   Physical Exam awake, alert.  Chest clear to auscultation bilaterally.  Heart with regular rate/ rhythm.  Abdomen obese, soft, positive bowel sounds, nontender.  Extremities with full range of motion.  Imaging: No results found.  Labs:  CBC: Recent Labs    11/12/18 0056 11/12/18 0824 11/12/18 1548 12/19/18 0947  WBC 10.8*  --   --  5.3  HGB 6.0* 7.2* 8.1* 8.9*  HCT 23.3* 27.1* 29.1* 30.8*  PLT 264  --   --  321     COAGS: No results for input(s): INR, APTT in the last 8760 hours.  BMP: Recent Labs    11/12/18 0056 12/19/18 0947  NA 137 138  K 3.8 4.0  CL 104 103  CO2 23 20  GLUCOSE 103* 77  BUN 6 9  CALCIUM 8.6* 9.1  CREATININE 0.70 0.68  GFRNONAA >60 110  GFRAA >60 127    LIVER FUNCTION TESTS: No results for input(s): BILITOT, AST, ALT, ALKPHOS, PROT, ALBUMIN in the last 8760 hours.  Assessment and Plan: Patient with history of symptomatic uterine fibroids; seen in consultation by Dr. Kathlene Cote on 01/18/2019 and deemed an appropriate candidate for bilateral uterine artery embolization.  She presents today for the procedure.Risks and benefits of procedure were discussed with the patient including, but not limited to bleeding, infection, vascular injury or contrast induced renal failure.  This interventional procedure involves the use of X-rays and because of the nature of the planned procedure, it is possible that we will have prolonged use of X-ray fluoroscopy.  Potential radiation risks to you include (but are not limited to) the following: - A slightly elevated risk for cancer  several years later in life. This risk is typically less than 0.5% percent. This risk is low in comparison to the normal incidence of human cancer, which is 33% for women and 50% for men according to the [employer]. - Radiation induced injury can include skin  redness, resembling a rash, tissue breakdown / ulcers and hair loss (which can be temporary or permanent).   The likelihood of either of these occurring depends on the difficulty of the procedure and whether you are sensitive to radiation due to previous procedures, disease, or genetic conditions.   IF your procedure requires a prolonged use of radiation, you will be notified and given written instructions for further action.  It is your responsibility to monitor the irradiated area for the 2 weeks following the procedure and to notify your  physician if you are concerned that you have suffered a radiation induced injury.    All of the patient's questions were answered, patient is agreeable to proceed.  Consent signed and in chart.  LABS PENDING  Post procedure she will be admitted to hospital for overnight observation for pain control   Electronically Signed: D. Rowe Robert, PA-C 06/18/2019, 12:21 PM   I spent a total of 30 minutes at the the patient's bedside AND on the patient's hospital floor or unit, greater than 50% of which was counseling/coordinating care for bilateral uterine artery embolization

## 2019-06-18 NOTE — Plan of Care (Signed)
  Problem: Education: Goal: Knowledge of General Education information will improve Description: Including pain rating scale, medication(s)/side effects and non-pharmacologic comfort measures Outcome: Progressing   Problem: Health Behavior/Discharge Planning: Goal: Ability to manage health-related needs will improve Outcome: Progressing   Problem: Clinical Measurements: Goal: Ability to maintain clinical measurements within normal limits will improve Outcome: Progressing Goal: Will remain free from infection Outcome: Progressing Goal: Cardiovascular complication will be avoided Outcome: Progressing   Problem: Nutrition: Goal: Adequate nutrition will be maintained Outcome: Progressing   Problem: Coping: Goal: Level of anxiety will decrease Outcome: Progressing   Problem: Pain Managment: Goal: General experience of comfort will improve Outcome: Progressing   Problem: Safety: Goal: Ability to remain free from injury will improve Outcome: Progressing   Problem: Skin Integrity: Goal: Risk for impaired skin integrity will decrease Outcome: Progressing

## 2019-06-19 LAB — CBC
HCT: 39.1 % (ref 36.0–46.0)
Hemoglobin: 11.2 g/dL — ABNORMAL LOW (ref 12.0–15.0)
MCH: 22.9 pg — ABNORMAL LOW (ref 26.0–34.0)
MCHC: 28.6 g/dL — ABNORMAL LOW (ref 30.0–36.0)
MCV: 80 fL (ref 80.0–100.0)
Platelets: 284 10*3/uL (ref 150–400)
RBC: 4.89 MIL/uL (ref 3.87–5.11)
RDW: 24.3 % — ABNORMAL HIGH (ref 11.5–15.5)
WBC: 7.9 10*3/uL (ref 4.0–10.5)
nRBC: 0 % (ref 0.0–0.2)

## 2019-06-19 MED ORDER — FLUCONAZOLE 150 MG PO TABS
150.0000 mg | ORAL_TABLET | Freq: Once | ORAL | Status: AC
  Administered 2019-06-19: 150 mg via ORAL
  Filled 2019-06-19: qty 1

## 2019-06-19 MED ORDER — OXYCODONE-ACETAMINOPHEN 5-325 MG PO TABS
1.0000 | ORAL_TABLET | ORAL | Status: DC | PRN
  Administered 2019-06-19 (×2): 2 via ORAL
  Filled 2019-06-19 (×2): qty 2

## 2019-06-19 MED ORDER — NAPROXEN 500 MG PO TABS: 500.0000 mg | ORAL_TABLET | Freq: Two times a day (BID) | ORAL | 0 refills | Status: DC

## 2019-06-19 MED ORDER — PROMETHAZINE HCL 25 MG PO TABS: 25.0000 mg | ORAL_TABLET | Freq: Three times a day (TID) | ORAL | 0 refills | Status: DC | PRN

## 2019-06-19 MED ORDER — OXYCODONE-ACETAMINOPHEN 5-325 MG PO TABS: 1.0000 | ORAL_TABLET | ORAL | 0 refills | Status: DC | PRN

## 2019-06-19 NOTE — Discharge Summary (Addendum)
Patient ID: Jordan Pruitt MRN: [id number] DOB/AGE: 05-17-1976 43 y.o.  Admit date: 06/18/2019 Discharge date: 06/19/2019  Supervising Physician: Pilar Jarvis  Patient Status: Jordan Pruitt IP  Admission Diagnoses: Symptomatic uterine fibroids  Discharge Diagnoses: Symptomatic uterine fibroids, status post bilateral uterine artery embolization on 06/18/2019 Active Problems:   Uterine leiomyoma  Past Medical History:  Diagnosis Date   Anemia    Fibroids    Menorrhagia    Obesity    Paronychia    Past Surgical History:  Procedure Laterality Date   CESAREAN SECTION     x4   IR ANGIOGRAM PELVIS SELECTIVE OR SUPRASELECTIVE  06/18/2019   IR ANGIOGRAM PELVIS SELECTIVE OR SUPRASELECTIVE  06/18/2019   IR ANGIOGRAM SELECTIVE EACH ADDITIONAL VESSEL  06/18/2019   IR ANGIOGRAM SELECTIVE EACH ADDITIONAL VESSEL  06/18/2019   IR EMBO TUMOR ORGAN ISCHEMIA INFARCT INC GUIDE ROADMAPPING  06/18/2019   IR RADIOLOGIST EVAL & MGMT  01/18/2019   IR US GUIDE VASC ACCESS RIGHT  06/18/2019   TUBAL LIGATION      Discharged Condition: good  Hospital Course: Jordan Pruitt is a 43 year old female with history of symptomatic uterine fibroids who was seen in consultation by Dr. Kathlene Cote on 01/18/2019 to discuss treatment options.  She was deemed an appropriate candidate for bilateral uterine artery embolization and underwent the procedure at [facility] on 06/18/2019.  The procedure was performed via IV conscious sedation and without immediate complications.  She was subsequently admitted for overnight observation for pain control.  She was placed on Dilaudid PCA pump and given antiemetics as needed.  Overnight the patient did fairly well with expected intermittent nausea and pelvic cramping.  On the day of discharge she was stable but continued to have some pelvic cramping.  She was able to tolerate her diet, ambulate and void without significant difficulty.  She was given 1 dose of Diflucan 150 mg p.o. for vaginal yeast  infection.  Follow-up hemoglobin today was 11.2 up from 10 yesterday.  She was deemed stable for discharge.  Prescriptions were sent into her pharmacy for Naprosyn, Phenergan and Percocet.  She will continue her current home medications.  Radiology service will contact patient in 3 to 4 weeks for either in person clinic visit or telephone call.  She was told to contact our service with any additional procedure related questions.  Consults: none  Significant Diagnostic Studies: Results for orders placed or performed during the hospital encounter of 96/78/93  Basic metabolic panel  Result Value Ref Range   Sodium 140 135 - 145 mmol/L   Potassium 3.9 3.5 - 5.1 mmol/L   Chloride 106 98 - 111 mmol/L   CO2 22 22 - 32 mmol/L   Glucose, Bld 91 70 - 99 mg/dL   BUN 9 6 - 20 mg/dL   Creatinine, Ser 0.68 0.44 - 1.00 mg/dL   Calcium 8.9 8.9 - 10.3 mg/dL   GFR calc non Af Amer >60 >60 mL/min   GFR calc Af Amer >60 >60 mL/min   Anion gap 12 5 - 15  CBC with Differential/Platelet  Result Value Ref Range   WBC 3.6 (L) 4.0 - 10.5 K/uL   RBC 4.38 3.87 - 5.11 MIL/uL   Hemoglobin 10.0 (L) 12.0 - 15.0 g/dL   HCT 34.7 (L) 36.0 - 46.0 %   MCV 79.2 (L) 80.0 - 100.0 fL   MCH 22.8 (L) 26.0 - 34.0 pg   MCHC 28.8 (L) 30.0 - 36.0 g/dL   RDW 24.4 (H) 11.5 -  15.5 %   Platelets 265 150 - 400 K/uL   nRBC 0.0 0.0 - 0.2 %   Neutrophils Relative % 53 %   Neutro Abs 1.9 1.7 - 7.7 K/uL   Lymphocytes Relative 36 %   Lymphs Abs 1.3 0.7 - 4.0 K/uL   Monocytes Relative 9 %   Monocytes Absolute 0.3 0.1 - 1.0 K/uL   Eosinophils Relative 2 %   Eosinophils Absolute 0.1 0.0 - 0.5 K/uL   Basophils Relative 0 %   Basophils Absolute 0.0 0.0 - 0.1 K/uL   Smear Review MORPHOLOGY UNREMARKABLE    Immature Granulocytes 0 %   Abs Immature Granulocytes 0.01 0.00 - 0.07 K/uL  hCG, serum, qualitative  Result Value Ref Range   Preg, Serum NEGATIVE NEGATIVE  Protime-INR  Result Value Ref Range   Prothrombin Time 12.2 11.4 - 15.2  seconds   INR 0.9 0.8 - 1.2  CBC  Result Value Ref Range   WBC 7.9 4.0 - 10.5 K/uL   RBC 4.89 3.87 - 5.11 MIL/uL   Hemoglobin 11.2 (L) 12.0 - 15.0 g/dL   HCT 39.1 36.0 - 46.0 %   MCV 80.0 80.0 - 100.0 fL   MCH 22.9 (L) 26.0 - 34.0 pg   MCHC 28.6 (L) 30.0 - 36.0 g/dL   RDW 24.3 (H) 11.5 - 15.5 %   Platelets 284 150 - 400 K/uL   nRBC 0.0 0.0 - 0.2 %     Treatments: Successful bilateral uterine artery embolization on 06/18/2019 via IV conscious sedation  Discharge Exam: Blood pressure (!) 175/84, pulse 62, temperature 97.8 F (36.6 C), temperature source Oral, resp. rate (!) 21, height 5\' 3"  (1.6 m), weight 266 lb 1.5 oz (120.7 kg), last menstrual period 06/03/2019, SpO2 97 %. Awake, alert.  Chest clear to auscultation bilaterally.  Heart with regular rate and rhythm.  Abdomen obese, soft, positive bowel sounds, tender anterior pelvic region to palpation; puncture site right common femoral artery soft, clean, dry, no hematoma, nontender; extremities with full range of motion, intact distal pulses.  Disposition: Discharge disposition: 01-Home or Self Care      Current Outpatient Medications  Medication Instructions   doxycycline (VIBRAMYCIN) 100 mg, Oral, 2 times daily   ferrous sulfate 325 mg, Oral, 3 times daily with meals   HYDROcodone-acetaminophen (NORCO/VICODIN) 5-325 MG tablet 1 tablet, Oral, Every 6 hours PRN   Multiple Vitamins-Calcium (ONE-A-DAY WOMENS FORMULA PO) Oral   naproxen (NAPROSYN) 500 mg, Oral, 2 times daily with meals   oxyCODONE-acetaminophen (PERCOCET/ROXICET) 5-325 MG tablet 1-2 tablets, Oral, Every 4 hours PRN   promethazine (PHENERGAN) 25 mg, Oral, Every 8 hours PRN      Discharge Instructions    Call MD for:  difficulty breathing, headache or visual disturbances   Complete by: As directed    Call MD for:  extreme fatigue   Complete by: As directed    Call MD for:  hives   Complete by: As directed    Call MD for:  persistant dizziness or  light-headedness   Complete by: As directed    Call MD for:  persistant nausea and vomiting   Complete by: As directed    Call MD for:  redness, tenderness, or signs of infection (pain, swelling, redness, odor or green/yellow discharge around incision site)   Complete by: As directed    Call MD for:  severe uncontrolled pain   Complete by: As directed    Call MD for:  temperature >100.4   Complete  by: As directed    Change dressing (specify)   Complete by: As directed    May keep Band-Aid over puncture site right groin for the next 2 to 3 days.  May wash site with soap and water.  May change bandage daily.   Diet - low sodium heart healthy   Complete by: As directed    Driving Restrictions   Complete by: As directed    no driving for the next 24 hours or after taking narcotic agents   Increase activity slowly   Complete by: As directed    Lifting restrictions   Complete by: As directed    Avoid heavy lifting for the next 3 to 4 days   May shower / Bathe   Complete by: As directed    May walk up steps   Complete by: As directed    Sexual Activity Restrictions   Complete by: As directed    No sexual intercourse for 1 week      Follow-up Information    Aletta Edouard, MD Follow up.   Specialties: Interventional Radiology, Radiology Why: Radiology service will call you with follow-up appointment with Dr. Kathlene Cote either in person at the [facility] or via telephone in 3 to 4 weeks; call [phone] or [phone] with any questions Contact information: [address] [place] [address] [phone]        Emily Filbert, MD Follow up.   Specialty: Obstetrics and Gynecology Why: Follow-up with Dr. Hulan Fray as scheduled Contact information: [place] [address] [phone]            Electronically Signed: D. Rowe Robert, PA-C 06/19/2019, 1:34 PM   I have spent Less Than 30 Minutes discharging Jordan Pruitt.

## 2019-06-19 NOTE — Plan of Care (Signed)

## 2019-06-19 NOTE — Progress Notes (Signed)
Upon MD order to d/c PCA pump, RN wasted remaining 21.5 mg in stericycle, with Theora Gianotti, RN as witness.

## 2019-06-19 NOTE — Discharge Instructions (Signed)
Uterine Artery Embolization for Fibroids  Uterine artery embolization is a procedure to shrink uterine fibroids. Uterine fibroids are masses of tissue (tumors) that can develop in the womb (uterus). They are also called leiomyomas. This type of tumor is not cancerous (benign) and does not spread to other parts of the body outside of the pelvic area. The pelvic area is the part of the body between the hip bones. You can have one or many fibroids. Fibroids can vary in size, shape, weight, and where they grow in the uterus. Some can become quite large. In this procedure, a thin plastic tube (catheter) is used to inject a chemical that blocks off the blood supply to the fibroid, which causes the fibroid to shrink. Tell a health care provider about:  Any allergies you have.  All medicines you are taking, including vitamins, herbs, eye drops, creams, and over-the-counter medicines.  Any problems you or family members have had with anesthetic medicines.  Any blood disorders you have.  Any surgeries you have had.  Any medical conditions you have.  Whether you are pregnant or may be pregnant. What are the risks? Generally, this is a safe procedure. However, problems may occur, including:  Bleeding.  Allergic reactions to medicines or dyes.  Damage to other structures or organs.  Infection, including blood infection (septicemia).  Injury to the uterus from decreased blood supply.  Lack of menstrual periods (amenorrhea).  Death of tissue cells (necrosis) around your bladder or vulva.  Development of a hole between organs or from an organ to the surface of your skin (fistula).  Blood clot in the legs (deep vein thrombosis) or lung (pulmonary embolus).  Nausea and vomiting. What happens before the procedure? Staying hydrated Follow instructions from your health care provider about hydration, which may include:  Up to 2 hours before the procedure - you may continue to drink clear  liquids, such as water, clear fruit juice, black coffee, and plain tea. Eating and drinking restrictions Follow instructions from your health care provider about eating and drinking, which may include:  8 hours before the procedure - stop eating heavy meals or foods such as meat, fried foods, or fatty foods.  6 hours before the procedure - stop eating light meals or foods, such as toast or cereal.  6 hours before the procedure - stop drinking milk or drinks that contain milk.  2 hours before the procedure - stop drinking clear liquids. Medicines  Ask your health care provider about: ? Changing or stopping your regular medicines. This is especially important if you are taking diabetes medicines or blood thinners. ? Taking over-the-counter medicines, vitamins, herbs, and supplements. ? Taking medicines such as aspirin and ibuprofen. These medicines can thin your blood. Do not take these medicines unless your health care provider tells you to take them.  You may be given antibiotic medicine to help prevent infection.  You may be given medicine to prevent nausea and vomiting (antiemetic). General instructions  Ask your health care provider how your surgical site will be marked or identified.  You may be asked to shower with a germ-killing soap.  Plan to have someone take you home from the hospital or clinic.  If you will be going home right after the procedure, plan to have someone with you for 24 hours.  You will be asked to empty your bladder. What happens during the procedure?  To lower your risk of infection: ? Your health care team will wash or sanitize their hands. ?   Hair may be removed from the surgical area. ? Your skin will be washed with soap.  An IV will be inserted into one of your veins.  You will be given one or more of the following: ? A medicine to help you relax (sedative). ? A medicine to numb the area (local anesthetic).  A small cut (incision) will be made  in your groin.  A catheter will be inserted into the main artery of your leg. The catheter will be guided through the artery to your uterus.  A series of images will be taken while dye is injected through the catheter in your groin. X-rays are taken at the same time. This is done to provide a road map of the blood supply to your uterus and fibroids.  Tiny plastic spheres, about the size of sand grains, will be injected through the catheter. Metal coils may be used to help block the artery. The particles will lodge in tiny branches of the uterine artery that supplies blood to the fibroids.  The procedure will be repeated on the artery that supplies the other side of the uterus.  The catheter will be removed and pressure will be applied to stop the bleeding.  A dressing will be placed over the incision. The procedure may vary among health care providers and hospitals. What happens after the procedure?  Your blood pressure, heart rate, breathing rate, and blood oxygen level will be monitored until the medicines you were given have worn off.  You will be given pain medicine as needed.  You may be given medicine for nausea and vomiting as needed.  Do not drive for 24 hours if you were given a sedative. Summary  Uterine artery embolization is a procedure to shrink uterine fibroids by blocking the blood supply to the fibroid.  You may be given a sedative and local anesthetic for the procedure.  A catheter will be inserted into the main artery of your leg. The catheter will be guided through the artery to your uterus.  After the procedure you will be given pain medicine and medicine for nausea as needed.  Do not drive for 24 hours if you were given a sedative. This information is not intended to replace advice given to you by your health care provider. Make sure you discuss any questions you have with your health care provider. Document Released: 01/17/2006 Document Revised: 10/14/2017  Document Reviewed: 02/03/2017 Elsevier Patient Education  2020 [employer]  

## 2019-06-23 ENCOUNTER — Emergency Department (HOSPITAL_COMMUNITY)
Admission: EM | Admit: 2019-06-23 | Discharge: 2019-06-24 | Disposition: A | Discharge status: Home / Self Care | Payer: Self-pay | Attending: Emergency Medicine | Admitting: Emergency Medicine

## 2019-06-23 ENCOUNTER — Encounter (HOSPITAL_COMMUNITY): Payer: Self-pay | Admitting: Emergency Medicine

## 2019-06-23 ENCOUNTER — Emergency Department (HOSPITAL_COMMUNITY): Payer: Self-pay

## 2019-06-23 ENCOUNTER — Other Ambulatory Visit: Payer: Self-pay

## 2019-06-23 DIAGNOSIS — R300 Dysuria: Secondary | ICD-10-CM | POA: Insufficient documentation

## 2019-06-23 DIAGNOSIS — N939 Abnormal uterine and vaginal bleeding, unspecified: Secondary | ICD-10-CM | POA: Insufficient documentation

## 2019-06-23 DIAGNOSIS — Z79899 Other long term (current) drug therapy: Secondary | ICD-10-CM | POA: Insufficient documentation

## 2019-06-23 DIAGNOSIS — R102 Pelvic and perineal pain: Secondary | ICD-10-CM | POA: Insufficient documentation

## 2019-06-23 DIAGNOSIS — Z87891 Personal history of nicotine dependence: Secondary | ICD-10-CM | POA: Insufficient documentation

## 2019-06-23 DIAGNOSIS — R350 Frequency of micturition: Secondary | ICD-10-CM | POA: Insufficient documentation

## 2019-06-23 LAB — CBC WITH DIFFERENTIAL/PLATELET
Abs Immature Granulocytes: 0.01 10*3/uL (ref 0.00–0.07)
Basophils Absolute: 0 10*3/uL (ref 0.0–0.1)
Basophils Relative: 0 %
Eosinophils Absolute: 0 10*3/uL (ref 0.0–0.5)
Eosinophils Relative: 0 %
HCT: 35.4 % — ABNORMAL LOW (ref 36.0–46.0)
Hemoglobin: 10.4 g/dL — ABNORMAL LOW (ref 12.0–15.0)
Immature Granulocytes: 0 %
Lymphocytes Relative: 14 %
Lymphs Abs: 1.3 10*3/uL (ref 0.7–4.0)
MCH: 22.8 pg — ABNORMAL LOW (ref 26.0–34.0)
MCHC: 29.4 g/dL — ABNORMAL LOW (ref 30.0–36.0)
MCV: 77.6 fL — ABNORMAL LOW (ref 80.0–100.0)
Monocytes Absolute: 0.8 10*3/uL (ref 0.1–1.0)
Monocytes Relative: 9 %
Neutro Abs: 6.9 10*3/uL (ref 1.7–7.7)
Neutrophils Relative %: 77 %
Platelets: 250 10*3/uL (ref 150–400)
RBC: 4.56 MIL/uL (ref 3.87–5.11)
RDW: 24.3 % — ABNORMAL HIGH (ref 11.5–15.5)
WBC: 9 10*3/uL (ref 4.0–10.5)
nRBC: 0 % (ref 0.0–0.2)

## 2019-06-23 LAB — COMPREHENSIVE METABOLIC PANEL
ALT: 26 U/L (ref 0–44)
AST: 21 U/L (ref 15–41)
Albumin: 3.8 g/dL (ref 3.5–5.0)
Alkaline Phosphatase: 70 U/L (ref 38–126)
Anion gap: 11 (ref 5–15)
BUN: 6 mg/dL (ref 6–20)
CO2: 24 mmol/L (ref 22–32)
Calcium: 9.1 mg/dL (ref 8.9–10.3)
Chloride: 100 mmol/L (ref 98–111)
Creatinine, Ser: 0.59 mg/dL (ref 0.44–1.00)
GFR calc Af Amer: >60 mL/min (ref >60)
GFR calc non Af Amer: >60 mL/min (ref >60)
Glucose, Bld: 109 mg/dL — ABNORMAL HIGH (ref 70–99)
Potassium: 3.5 mmol/L (ref 3.5–5.1)
Sodium: 135 mmol/L (ref 135–145)
Total Bilirubin: 0.8 mg/dL (ref 0.3–1.2)
Total Protein: 8.3 g/dL — ABNORMAL HIGH (ref 6.5–8.1)

## 2019-06-23 LAB — URINALYSIS, ROUTINE W REFLEX MICROSCOPIC
Bacteria, UA: NONE SEEN
Bilirubin Urine: NEGATIVE
Glucose, UA: NEGATIVE mg/dL
Ketones, ur: 20 mg/dL — AB
Leukocytes,Ua: NEGATIVE
Nitrite: NEGATIVE
Protein, ur: NEGATIVE mg/dL
Specific Gravity, Urine: 1.013 (ref 1.005–1.030)
pH: 7 (ref 5.0–8.0)

## 2019-06-23 MED ORDER — SODIUM CHLORIDE (PF) 0.9 % IJ SOLN
INTRAMUSCULAR | Status: AC
  Administered 2019-06-23
  Filled 2019-06-23: qty 50

## 2019-06-23 MED ORDER — IOHEXOL 300 MG/ML  SOLN
100.0000 mL | Freq: Once | INTRAMUSCULAR | Status: AC | PRN
  Administered 2019-06-23: 100 mL via INTRAVENOUS

## 2019-06-23 MED ORDER — SODIUM CHLORIDE 0.9 % IV BOLUS
1000.0000 mL | Freq: Once | INTRAVENOUS | Status: AC
  Administered 2019-06-23: 1000 mL via INTRAVENOUS

## 2019-06-23 MED ORDER — OXYCODONE-ACETAMINOPHEN 5-325 MG PO TABS
1.0000 | ORAL_TABLET | Freq: Once | ORAL | Status: AC
  Administered 2019-06-23: 1 via ORAL
  Filled 2019-06-23: qty 1

## 2019-06-23 MED ORDER — FENTANYL CITRATE (PF) 100 MCG/2ML IJ SOLN
50.0000 ug | Freq: Once | INTRAMUSCULAR | Status: AC
  Administered 2019-06-23: 21:00:00 50 ug via INTRAVENOUS
  Filled 2019-06-23: qty 2

## 2019-06-23 MED ORDER — FENTANYL CITRATE (PF) 100 MCG/2ML IJ SOLN
50.0000 ug | Freq: Once | INTRAMUSCULAR | Status: AC
  Administered 2019-06-23: 50 ug via INTRAVENOUS
  Filled 2019-06-23: qty 2

## 2019-06-23 NOTE — ED Triage Notes (Signed)
Patient has suffered from 8/10 pain since surgery for fibroids on Monday. She is unable to find a comfortable position. Heat has helped some. Prescribed pain medication has helped, but the patient does not want to continue taking it so she has been taking OTC pain medication.

## 2019-06-23 NOTE — ED Provider Notes (Signed)
[facility] DEPT Provider Note   CSN: [id number] Arrival date & time: 06/23/19  1931    History   Chief Complaint Chief Complaint  Patient presents with  . Abdominal Pain  . Pelvic Pain    HPI Jordan Pruitt is a 43 y.o. female.      The history is provided by the patient and medical records. No language interpreter was used.  Abdominal Pain Pelvic Pain Associated symptoms include abdominal pain.   Jordan Pruitt is a 43 y.o. female who presents to the Emergency Department complaining of abdominal pain.  She had a fibroid embolization performed on Monday and was discharged home on Tuesday.  Since then she has experienced progressive abdominal/pelvic pain radiating to the back.  She feels like she is having contractions.  No fevers, N/V, no vaginal discharge.  She has light vaginal spotting.  She has dysuria and frequency.  Today she felt like a rubber band popped in her abdomen and since then she has experienced a waxing and waning left sided abdominal pain.  She has associated constipation, last BM this morning after using a laxative. She is taking aleve and percocet at home for pain.  She gets knocked out from the percocet.    Dr. Kathlene Cote performed her surgery.   Past Medical History:  Diagnosis Date  . Anemia   . Fibroids   . Menorrhagia   . Obesity   . Paronychia     Patient Active Problem List   Diagnosis Date Noted  . Uterine leiomyoma 06/18/2019  . Menorrhagia with regular cycle 12/19/2018  . Polyuria 12/19/2018  . Symptomatic anemia 11/12/2018  . Uterine fibroid 11/12/2018    Past Surgical History:  Procedure Laterality Date  . CESAREAN SECTION     x4  . IR ANGIOGRAM PELVIS SELECTIVE OR SUPRASELECTIVE  06/18/2019  . IR ANGIOGRAM PELVIS SELECTIVE OR SUPRASELECTIVE  06/18/2019  . IR ANGIOGRAM SELECTIVE EACH ADDITIONAL VESSEL  06/18/2019  . IR ANGIOGRAM SELECTIVE EACH ADDITIONAL VESSEL  06/18/2019  . IR EMBO TUMOR ORGAN ISCHEMIA  INFARCT INC GUIDE ROADMAPPING  06/18/2019  . IR RADIOLOGIST EVAL & MGMT  01/18/2019  . IR US GUIDE VASC ACCESS RIGHT  06/18/2019  . TUBAL LIGATION       OB History    Gravida  5   Para  4   Term  3   Preterm  1   AB  1   Living  4     SAB  1   TAB      Ectopic      Multiple      Live Births  4            Home Medications    Prior to Admission medications   Medication Sig Start Date End Date Taking? Authorizing Provider  ferrous sulfate 325 (65 FE) MG EC tablet Take 1 tablet (325 mg total) by mouth 3 (three) times daily with meals. 12/19/18   Elsie Stain, MD  Multiple Vitamins-Calcium (ONE-A-DAY WOMENS FORMULA PO) Take by mouth.    [provider]  naproxen (NAPROSYN) 500 MG tablet Take 1 tablet (500 mg total) by mouth 2 (two) times daily with a meal. 06/19/19   Allred, Darrell K, PA-C  oxyCODONE-acetaminophen (PERCOCET/ROXICET) 5-325 MG tablet Take 1 tablet by mouth every 6 (six) hours as needed for severe pain. 06/24/19   Quintella Reichert, MD  promethazine (PHENERGAN) 25 MG tablet Take 1 tablet (25 mg total) by mouth every  8 (eight) hours as needed for nausea. 06/19/19   Allred, Shirlyn Goltz, PA-C    Family History Family History  Problem Relation Age of Onset  . Hypertension Father   . Hypertension Mother   . Diabetes Mother   . Diabetes Maternal Aunt     Social History Social History   Tobacco Use  . Smoking status: Former Smoker    Packs/day: 0.50    Types: Cigarettes    Quit date: 2018    Years since quitting: 2.6  . Smokeless tobacco: Never Used  Substance Use Topics  . Alcohol use: Yes  . Drug use: No     Allergies   Ibuprofen   Review of Systems Review of Systems  Gastrointestinal: Positive for abdominal pain.  Genitourinary: Positive for pelvic pain.  All other systems reviewed and are negative.    Physical Exam Updated Vital Signs BP (!) 146/81   Pulse 88   Temp 98.9 F (37.2 C) (Oral)   Resp 18   Ht 5\' 3"  (1.6 m)   Wt  119.3 kg   LMP 06/03/2019   SpO2 99%   BMI 46.59 kg/m   Physical Exam Vitals signs and nursing note reviewed.  Constitutional:      Appearance: She is well-developed.  HENT:     Head: Normocephalic and atraumatic.  Cardiovascular:     Rate and Rhythm: Normal rate and regular rhythm.  Pulmonary:     Effort: Pulmonary effort is normal. No respiratory distress.  Abdominal:     Palpations: Abdomen is soft.     Tenderness: There is no guarding or rebound.     Comments: Mild to moderate generalized abdominal tenderness, greatest over the lower abdomen.    Musculoskeletal:        General: No tenderness.  Skin:    General: Skin is warm and dry.  Neurological:     Mental Status: She is alert and oriented to person, place, and time.  Psychiatric:        Mood and Affect: Mood normal.        Behavior: Behavior normal.      ED Treatments / Results  Labs (all labs ordered are listed, but only abnormal results are displayed) Labs Reviewed  COMPREHENSIVE METABOLIC PANEL - Abnormal; Notable for the following components:      Result Value   Glucose, Bld 109 (*)    Total Protein 8.3 (*)    All other components within normal limits  CBC WITH DIFFERENTIAL/PLATELET - Abnormal; Notable for the following components:   Hemoglobin 10.4 (*)    HCT 35.4 (*)    MCV 77.6 (*)    MCH 22.8 (*)    MCHC 29.4 (*)    RDW 24.3 (*)    All other components within normal limits  URINALYSIS, ROUTINE W REFLEX MICROSCOPIC - Abnormal; Notable for the following components:   Hgb urine dipstick SMALL (*)    Ketones, ur 20 (*)    All other components within normal limits  URINE CULTURE    EKG None  Radiology Ct Abdomen Pelvis W Contrast  Result Date: 06/24/2019 CLINICAL DATA:  Abdominal pain following recent fibroid embolization EXAM: CT ABDOMEN AND PELVIS WITH CONTRAST TECHNIQUE: Multidetector CT imaging of the abdomen and pelvis was performed using the standard protocol following bolus administration  of intravenous contrast. CONTRAST:  190mL OMNIPAQUE IOHEXOL 300 MG/ML  SOLN COMPARISON:  01/26/2019 FINDINGS: Lower chest: No acute abnormality. Hepatobiliary: No focal liver abnormality is seen. No gallstones, gallbladder  wall thickening, or biliary dilatation. Pancreas: Unremarkable. No pancreatic ductal dilatation or surrounding inflammatory changes. Spleen: Normal in size without focal abnormality. Adrenals/Urinary Tract: Adrenal glands are unremarkable. Kidneys are normal, without renal calculi, focal lesion, or hydronephrosis. Bladder is unremarkable. Stomach/Bowel: Stomach is within normal limits. Appendix appears normal. No evidence of bowel wall thickening, distention, or inflammatory changes. Vascular/Lymphatic: Aortic atherosclerosis. No enlarged abdominal or pelvic lymph nodes. Reproductive: Uterus is again well visualized and bulky consistent with the history of fibroids. The overall appearance is similar to that seen on prior MRI examination. No adnexal abnormality is seen. Other: No abdominal wall hernia or abnormality. No abdominopelvic ascites. Musculoskeletal: No acute or significant osseous findings. IMPRESSION: Prominent uterus consistent with fibroid disease. No acute abnormality noted. Electronically Signed   By: Inez Catalina M.D.   On: 06/24/2019 00:14    Procedures Procedures (including critical care time)  Medications Ordered in ED Medications  sodium chloride 0.9 % bolus 1,000 mL (1,000 mLs Intravenous New Bag/Given 06/23/19 2100)  fentaNYL (SUBLIMAZE) injection 50 mcg (50 mcg Intravenous Given 06/23/19 2054)  fentaNYL (SUBLIMAZE) injection 50 mcg (50 mcg Intravenous Given 06/23/19 2316)  oxyCODONE-acetaminophen (PERCOCET/ROXICET) 5-325 MG per tablet 1 tablet (1 tablet Oral Given 06/23/19 2316)  sodium chloride (PF) 0.9 % injection (  Given by Other 06/23/19 2341)  iohexol (OMNIPAQUE) 300 MG/ML solution 100 mL (100 mLs Intravenous Contrast Given 06/23/19 2340)     Initial Impression /  Assessment and Plan / ED Course  I have reviewed the triage vital signs and the nursing notes.  Pertinent labs & imaging results that were available during my care of the patient were reviewed by me and considered in my medical decision making (see chart for details).        Patient here for evaluation of pelvic pain following uterine artery embolization on Monday. She is uncomfortable appearing on evaluation but non-toxic appearing. She did not take any of her Percocet today. She has mild generalized tenderness without peritoneal findings. UA is not consistent with UTI. Labs demonstrate stable anemia. She is having minimal imaginal bleeding. CT abdomen pelvis is negative for complicating features. Discussed with patient home care for abdominal pain following uterine artery ablation. Discussed continuing her relief. She may use Percocet as needed for pain as well. Discussed importance of OB/GYN follow-up as well as return precautions.  Final Clinical Impressions(s) / ED Diagnoses   Final diagnoses:  Pelvic pain in female    ED Discharge Orders         Ordered    oxyCODONE-acetaminophen (PERCOCET/ROXICET) 5-325 MG tablet  Every 6 hours PRN     06/24/19 0029           Quintella Reichert, MD 06/24/19 [phone]

## 2019-06-24 MED ORDER — OXYCODONE-ACETAMINOPHEN 5-325 MG PO TABS: 1.0000 | ORAL_TABLET | Freq: Four times a day (QID) | ORAL | 0 refills | Status: DC | PRN

## 2019-06-25 LAB — URINE CULTURE

## 2019-07-17 ENCOUNTER — Other Ambulatory Visit: Payer: Self-pay

## 2019-07-17 ENCOUNTER — Ambulatory Visit
Admission: RE | Admit: 2019-07-17 | Discharge: 2019-07-17 | Disposition: A | Discharge status: Home / Self Care | Payer: Medicaid Other | Source: Ambulatory Visit | Attending: Radiology | Admitting: Radiology

## 2019-07-17 DIAGNOSIS — D259 Leiomyoma of uterus, unspecified: Secondary | ICD-10-CM

## 2019-07-17 HISTORY — PX: IR RADIOLOGIST EVAL & MGMT: IMG5224

## 2019-07-17 NOTE — Progress Notes (Signed)
Chief Complaint: Patient was consulted remotely today (TeleHealth) for follow-up status post uterine fibroid embolization procedure on 06/18/2019.  History of Present Illness: Jordan Pruitt is a 43 y.o. female with symptomatic uterine fibroids who underwent fibroid embolization with successful bilateral uterine artery embolization on 06/18/2019.  She was discharged on 06/19/2019 and return to the Emergency Department on 06/23/2019 with abdominal pain.  Postprocedural pain has completely resolved since that time.  She started a menstrual cycle yesterday on 07/16/2019 and already notes that she has significantly less bleeding with this cycle compared to her usual severe menorrhagia.  She also states that lower extremity cramping has significantly improved after the procedure and she also has lost approximately 10 pounds in weight.  She describes subjective increased energy levels.  Past Medical History:  Diagnosis Date   Anemia    Fibroids    Menorrhagia    Obesity    Paronychia     Past Surgical History:  Procedure Laterality Date   CESAREAN SECTION     x4   IR ANGIOGRAM PELVIS SELECTIVE OR SUPRASELECTIVE  06/18/2019   IR ANGIOGRAM PELVIS SELECTIVE OR SUPRASELECTIVE  06/18/2019   IR ANGIOGRAM SELECTIVE EACH ADDITIONAL VESSEL  06/18/2019   IR ANGIOGRAM SELECTIVE EACH ADDITIONAL VESSEL  06/18/2019   IR EMBO TUMOR ORGAN ISCHEMIA INFARCT INC GUIDE ROADMAPPING  06/18/2019   IR RADIOLOGIST EVAL & MGMT  01/18/2019   IR US GUIDE VASC ACCESS RIGHT  06/18/2019   TUBAL LIGATION      Allergies: Ibuprofen  Medications: Prior to Admission medications   Medication Sig Start Date End Date Taking? Authorizing Provider  ferrous sulfate 325 (65 FE) MG EC tablet Take 1 tablet (325 mg total) by mouth 3 (three) times daily with meals. 12/19/18   Elsie Stain, MD  Multiple Vitamins-Calcium (ONE-A-DAY WOMENS FORMULA PO) Take by mouth.    [provider]  naproxen (NAPROSYN) 500 MG tablet  Take 1 tablet (500 mg total) by mouth 2 (two) times daily with a meal. 06/19/19   Allred, Darrell K, PA-C  oxyCODONE-acetaminophen (PERCOCET/ROXICET) 5-325 MG tablet Take 1 tablet by mouth every 6 (six) hours as needed for severe pain. 06/24/19   Quintella Reichert, MD  promethazine (PHENERGAN) 25 MG tablet Take 1 tablet (25 mg total) by mouth every 8 (eight) hours as needed for nausea. 06/19/19   Allred, Shirlyn Goltz, PA-C     Family History  Problem Relation Age of Onset   Hypertension Father    Hypertension Mother    Diabetes Mother    Diabetes Maternal Aunt     Social History   Socioeconomic History   Marital status: Single    Spouse name: Not on file   Number of children: Not on file   Years of education: Not on file   Highest education level: Not on file  Occupational History   Not on file  Social Needs   Financial resource strain: Not on file   Food insecurity    Worry: Sometimes true    Inability: Sometimes true   Transportation needs    Medical: No    Non-medical: No  Tobacco Use   Smoking status: Former Smoker    Packs/day: 0.50    Types: Cigarettes    Quit date: 2018    Years since quitting: 2.6   Smokeless tobacco: Never Used  Substance and Sexual Activity   Alcohol use: Yes   Drug use: No   Sexual activity: Yes    Birth control/protection:  Surgical  Lifestyle   Physical activity    Days per week: Not on file    Minutes per session: Not on file   Stress: Not on file  Relationships   Social connections    Talks on phone: Not on file    Gets together: Not on file    Attends religious service: Not on file    Active member of club or organization: Not on file    Attends meetings of clubs or organizations: Not on file    Relationship status: Not on file  Other Topics Concern   Not on file  Social History Narrative   Not on file     Review of Systems  Constitutional: Negative.   Respiratory: Negative.   Cardiovascular: Negative.     Gastrointestinal: Negative.   Genitourinary: Negative.   Musculoskeletal: Negative.   Neurological: Negative.     Review of Systems: A 12 point ROS discussed and pertinent positives are indicated in the HPI above.  All other systems are negative.  Physical Exam No direct physical exam was performed (except for noted visual exam findings with Video Visits).   Vital Signs: There were no vitals taken for this visit.  Imaging: Ct Abdomen Pelvis W Contrast  Result Date: 06/24/2019 CLINICAL DATA:  Abdominal pain following recent fibroid embolization EXAM: CT ABDOMEN AND PELVIS WITH CONTRAST TECHNIQUE: Multidetector CT imaging of the abdomen and pelvis was performed using the standard protocol following bolus administration of intravenous contrast. CONTRAST:  175mL OMNIPAQUE IOHEXOL 300 MG/ML  SOLN COMPARISON:  01/26/2019 FINDINGS: Lower chest: No acute abnormality. Hepatobiliary: No focal liver abnormality is seen. No gallstones, gallbladder wall thickening, or biliary dilatation. Pancreas: Unremarkable. No pancreatic ductal dilatation or surrounding inflammatory changes. Spleen: Normal in size without focal abnormality. Adrenals/Urinary Tract: Adrenal glands are unremarkable. Kidneys are normal, without renal calculi, focal lesion, or hydronephrosis. Bladder is unremarkable. Stomach/Bowel: Stomach is within normal limits. Appendix appears normal. No evidence of bowel wall thickening, distention, or inflammatory changes. Vascular/Lymphatic: Aortic atherosclerosis. No enlarged abdominal or pelvic lymph nodes. Reproductive: Uterus is again well visualized and bulky consistent with the history of fibroids. The overall appearance is similar to that seen on prior MRI examination. No adnexal abnormality is seen. Other: No abdominal wall hernia or abnormality. No abdominopelvic ascites. Musculoskeletal: No acute or significant osseous findings. IMPRESSION: Prominent uterus consistent with fibroid disease. No  acute abnormality noted. Electronically Signed   By: Inez Catalina M.D.   On: 06/24/2019 00:14   Ir Angiogram Pelvis Selective Or Supraselective  Result Date: 06/18/2019 CLINICAL DATA:  Symptomatic uterine fibroids causing severe menorrhagia and anemia. The patient presents for uterine fibroid embolization. EXAM: 1. ULTRASOUND GUIDANCE FOR VASCULAR ACCESS OF THE RIGHT COMMON FEMORAL ARTERY 2. SELECTIVE ARTERIOGRAPHY OF THE RIGHT INTERNAL ILIAC ARTERY 3. SELECTIVE ARTERIOGRAPHY OF THE RIGHT UTERINE ARTERY 4. TRANSCATHETER EMBOLIZATION OF THE RIGHT UTERINE ARTERY 5. SELECTIVE ARTERIOGRAPHY OF THE LEFT INTERNAL ILIAC ARTERY 6. SELECTIVE ARTERIOGRAPHY OF THE LEFT UTERINE ARTERY 7. TRANSCATHETER EMBOLIZATION OF THE LEFT UTERINE ARTERY ANESTHESIA/SEDATION: Moderate (conscious) sedation was employed during this procedure. A total of Versed 6.0 mg and Fentanyl 300 mcg was administered intravenously. Moderate Sedation Time: 100 minutes. The patient's level of consciousness and vital signs were monitored continuously by radiology nursing throughout the procedure under my direct supervision. MEDICATIONS: Additional Medications: 4 mg IV Zofran, 1 mg IV Dilaudid FLUOROSCOPY TIME:  36 minutes.  3520 mGy CONTRAST:  129 mL Omnipaque 300 PROCEDURE: The procedure, risks, benefits, and alternatives were  explained to the patient. Questions regarding the procedure were encouraged and answered. The patient understands and consents to the procedure. A time-out was performed prior to initiating the procedure. The right groin was prepped with chlorhexidine in a sterile fashion, and a sterile drape was applied covering the operative field. A sterile gown and sterile gloves were used for the procedure. Local anesthesia was provided with 1% Lidocaine. After small skin incision, a 21 gauge needle was advanced into the right common femoral artery under direct ultrasound guidance. Ultrasound image documentation was performed. After establishing  guide wire access, a 5-French sheath was placed. A 5 Fr diagnostic catheter was advanced over a guidewire into the distal abdominal aorta. The catheter was then used to selectively catheterize the right common iliac artery followed by the right internal iliac artery. Selective arteriography of the right internal iliac artery was performed. A 2.8 Fr coaxial microcatheter was then introduced through the diagnostic catheter and advanced into the right uterine artery over a guide wire utilizing road mapping technique. Selective arteriography of the right uterine artery was performed through the microcatheter. Right uterine artery embolization was then performed with installation of microsphere particles. Follow-up arteriography was performed after embolization. The microcatheter was removed. The diagnostic catheter was then retracted and used to selectively catheterize the left internal iliac artery. Selective arteriography of the left internal iliac artery was performed. The microcatheter was then reintroduced and advanced into the left uterine artery over a guidewire utilizing road mapping technique. Selective arteriography of the left uterine artery was then performed through the microcatheter. Left uterine artery embolization was then performed with installation of microsphere particles. Follow-up arteriography was performed after embolization. Right femoral arteriotomy hemostasis: Cordis ExoSeal COMPLICATIONS: None. FINDINGS: Selective internal iliac arteriography bilaterally was able to define bilateral uterine arteries. Bilateral uterine arteriography shows multiple enlarged hypervascular trunks supplying uterine fibroids. Right uterine artery embolization was performed utilizing 1-2/3 vials of 500-700 micron sized Embosphere particles. Completion arteriography demonstrates adequate occlusion of branches supplying uterine fibroids. Left uterine artery embolization was performed utilizing 1-1/3 vials of 500-700  micron sized Embosphere particles. Completion arteriography demonstrates adequate occlusion of branches supplying uterine fibroids. Adequate hemostasis was achieved at the femoral arteriotomy site. IMPRESSION: Successful bilateral uterine artery embolization to treat symptomatic uterine fibroid disease. Adequate occlusion of branch vessels supplying uterine fibroids was achieved with microsphere particle embolization. The patient was admitted for overnight observation for treatment of post embolization symptoms. Electronically Signed   By: Aletta Edouard M.D.   On: 06/18/2019 17:28   Ir Angiogram Pelvis Selective Or Supraselective  Result Date: 06/18/2019 CLINICAL DATA:  Symptomatic uterine fibroids causing severe menorrhagia and anemia. The patient presents for uterine fibroid embolization. EXAM: 1. ULTRASOUND GUIDANCE FOR VASCULAR ACCESS OF THE RIGHT COMMON FEMORAL ARTERY 2. SELECTIVE ARTERIOGRAPHY OF THE RIGHT INTERNAL ILIAC ARTERY 3. SELECTIVE ARTERIOGRAPHY OF THE RIGHT UTERINE ARTERY 4. TRANSCATHETER EMBOLIZATION OF THE RIGHT UTERINE ARTERY 5. SELECTIVE ARTERIOGRAPHY OF THE LEFT INTERNAL ILIAC ARTERY 6. SELECTIVE ARTERIOGRAPHY OF THE LEFT UTERINE ARTERY 7. TRANSCATHETER EMBOLIZATION OF THE LEFT UTERINE ARTERY ANESTHESIA/SEDATION: Moderate (conscious) sedation was employed during this procedure. A total of Versed 6.0 mg and Fentanyl 300 mcg was administered intravenously. Moderate Sedation Time: 100 minutes. The patient's level of consciousness and vital signs were monitored continuously by radiology nursing throughout the procedure under my direct supervision. MEDICATIONS: Additional Medications: 4 mg IV Zofran, 1 mg IV Dilaudid FLUOROSCOPY TIME:  36 minutes.  3520 mGy CONTRAST:  129 mL Omnipaque 300 PROCEDURE: The procedure,  risks, benefits, and alternatives were explained to the patient. Questions regarding the procedure were encouraged and answered. The patient understands and consents to the procedure.  A time-out was performed prior to initiating the procedure. The right groin was prepped with chlorhexidine in a sterile fashion, and a sterile drape was applied covering the operative field. A sterile gown and sterile gloves were used for the procedure. Local anesthesia was provided with 1% Lidocaine. After small skin incision, a 21 gauge needle was advanced into the right common femoral artery under direct ultrasound guidance. Ultrasound image documentation was performed. After establishing guide wire access, a 5-French sheath was placed. A 5 Fr diagnostic catheter was advanced over a guidewire into the distal abdominal aorta. The catheter was then used to selectively catheterize the right common iliac artery followed by the right internal iliac artery. Selective arteriography of the right internal iliac artery was performed. A 2.8 Fr coaxial microcatheter was then introduced through the diagnostic catheter and advanced into the right uterine artery over a guide wire utilizing road mapping technique. Selective arteriography of the right uterine artery was performed through the microcatheter. Right uterine artery embolization was then performed with installation of microsphere particles. Follow-up arteriography was performed after embolization. The microcatheter was removed. The diagnostic catheter was then retracted and used to selectively catheterize the left internal iliac artery. Selective arteriography of the left internal iliac artery was performed. The microcatheter was then reintroduced and advanced into the left uterine artery over a guidewire utilizing road mapping technique. Selective arteriography of the left uterine artery was then performed through the microcatheter. Left uterine artery embolization was then performed with installation of microsphere particles. Follow-up arteriography was performed after embolization. Right femoral arteriotomy hemostasis: Cordis ExoSeal COMPLICATIONS: None. FINDINGS:  Selective internal iliac arteriography bilaterally was able to define bilateral uterine arteries. Bilateral uterine arteriography shows multiple enlarged hypervascular trunks supplying uterine fibroids. Right uterine artery embolization was performed utilizing 1-2/3 vials of 500-700 micron sized Embosphere particles. Completion arteriography demonstrates adequate occlusion of branches supplying uterine fibroids. Left uterine artery embolization was performed utilizing 1-1/3 vials of 500-700 micron sized Embosphere particles. Completion arteriography demonstrates adequate occlusion of branches supplying uterine fibroids. Adequate hemostasis was achieved at the femoral arteriotomy site. IMPRESSION: Successful bilateral uterine artery embolization to treat symptomatic uterine fibroid disease. Adequate occlusion of branch vessels supplying uterine fibroids was achieved with microsphere particle embolization. The patient was admitted for overnight observation for treatment of post embolization symptoms. Electronically Signed   By: Aletta Edouard M.D.   On: 06/18/2019 17:28   Ir Angiogram Selective Each Additional Vessel  Result Date: 06/18/2019 CLINICAL DATA:  Symptomatic uterine fibroids causing severe menorrhagia and anemia. The patient presents for uterine fibroid embolization. EXAM: 1. ULTRASOUND GUIDANCE FOR VASCULAR ACCESS OF THE RIGHT COMMON FEMORAL ARTERY 2. SELECTIVE ARTERIOGRAPHY OF THE RIGHT INTERNAL ILIAC ARTERY 3. SELECTIVE ARTERIOGRAPHY OF THE RIGHT UTERINE ARTERY 4. TRANSCATHETER EMBOLIZATION OF THE RIGHT UTERINE ARTERY 5. SELECTIVE ARTERIOGRAPHY OF THE LEFT INTERNAL ILIAC ARTERY 6. SELECTIVE ARTERIOGRAPHY OF THE LEFT UTERINE ARTERY 7. TRANSCATHETER EMBOLIZATION OF THE LEFT UTERINE ARTERY ANESTHESIA/SEDATION: Moderate (conscious) sedation was employed during this procedure. A total of Versed 6.0 mg and Fentanyl 300 mcg was administered intravenously. Moderate Sedation Time: 100 minutes. The patient's  level of consciousness and vital signs were monitored continuously by radiology nursing throughout the procedure under my direct supervision. MEDICATIONS: Additional Medications: 4 mg IV Zofran, 1 mg IV Dilaudid FLUOROSCOPY TIME:  36 minutes.  3520 mGy CONTRAST:  129  mL Omnipaque 300 PROCEDURE: The procedure, risks, benefits, and alternatives were explained to the patient. Questions regarding the procedure were encouraged and answered. The patient understands and consents to the procedure. A time-out was performed prior to initiating the procedure. The right groin was prepped with chlorhexidine in a sterile fashion, and a sterile drape was applied covering the operative field. A sterile gown and sterile gloves were used for the procedure. Local anesthesia was provided with 1% Lidocaine. After small skin incision, a 21 gauge needle was advanced into the right common femoral artery under direct ultrasound guidance. Ultrasound image documentation was performed. After establishing guide wire access, a 5-French sheath was placed. A 5 Fr diagnostic catheter was advanced over a guidewire into the distal abdominal aorta. The catheter was then used to selectively catheterize the right common iliac artery followed by the right internal iliac artery. Selective arteriography of the right internal iliac artery was performed. A 2.8 Fr coaxial microcatheter was then introduced through the diagnostic catheter and advanced into the right uterine artery over a guide wire utilizing road mapping technique. Selective arteriography of the right uterine artery was performed through the microcatheter. Right uterine artery embolization was then performed with installation of microsphere particles. Follow-up arteriography was performed after embolization. The microcatheter was removed. The diagnostic catheter was then retracted and used to selectively catheterize the left internal iliac artery. Selective arteriography of the left internal  iliac artery was performed. The microcatheter was then reintroduced and advanced into the left uterine artery over a guidewire utilizing road mapping technique. Selective arteriography of the left uterine artery was then performed through the microcatheter. Left uterine artery embolization was then performed with installation of microsphere particles. Follow-up arteriography was performed after embolization. Right femoral arteriotomy hemostasis: Cordis ExoSeal COMPLICATIONS: None. FINDINGS: Selective internal iliac arteriography bilaterally was able to define bilateral uterine arteries. Bilateral uterine arteriography shows multiple enlarged hypervascular trunks supplying uterine fibroids. Right uterine artery embolization was performed utilizing 1-2/3 vials of 500-700 micron sized Embosphere particles. Completion arteriography demonstrates adequate occlusion of branches supplying uterine fibroids. Left uterine artery embolization was performed utilizing 1-1/3 vials of 500-700 micron sized Embosphere particles. Completion arteriography demonstrates adequate occlusion of branches supplying uterine fibroids. Adequate hemostasis was achieved at the femoral arteriotomy site. IMPRESSION: Successful bilateral uterine artery embolization to treat symptomatic uterine fibroid disease. Adequate occlusion of branch vessels supplying uterine fibroids was achieved with microsphere particle embolization. The patient was admitted for overnight observation for treatment of post embolization symptoms. Electronically Signed   By: Aletta Edouard M.D.   On: 06/18/2019 17:28   Ir Angiogram Selective Each Additional Vessel  Result Date: 06/18/2019 CLINICAL DATA:  Symptomatic uterine fibroids causing severe menorrhagia and anemia. The patient presents for uterine fibroid embolization. EXAM: 1. ULTRASOUND GUIDANCE FOR VASCULAR ACCESS OF THE RIGHT COMMON FEMORAL ARTERY 2. SELECTIVE ARTERIOGRAPHY OF THE RIGHT INTERNAL ILIAC ARTERY 3.  SELECTIVE ARTERIOGRAPHY OF THE RIGHT UTERINE ARTERY 4. TRANSCATHETER EMBOLIZATION OF THE RIGHT UTERINE ARTERY 5. SELECTIVE ARTERIOGRAPHY OF THE LEFT INTERNAL ILIAC ARTERY 6. SELECTIVE ARTERIOGRAPHY OF THE LEFT UTERINE ARTERY 7. TRANSCATHETER EMBOLIZATION OF THE LEFT UTERINE ARTERY ANESTHESIA/SEDATION: Moderate (conscious) sedation was employed during this procedure. A total of Versed 6.0 mg and Fentanyl 300 mcg was administered intravenously. Moderate Sedation Time: 100 minutes. The patient's level of consciousness and vital signs were monitored continuously by radiology nursing throughout the procedure under my direct supervision. MEDICATIONS: Additional Medications: 4 mg IV Zofran, 1 mg IV Dilaudid FLUOROSCOPY TIME:  36 minutes.  3520 mGy CONTRAST:  129 mL Omnipaque 300 PROCEDURE: The procedure, risks, benefits, and alternatives were explained to the patient. Questions regarding the procedure were encouraged and answered. The patient understands and consents to the procedure. A time-out was performed prior to initiating the procedure. The right groin was prepped with chlorhexidine in a sterile fashion, and a sterile drape was applied covering the operative field. A sterile gown and sterile gloves were used for the procedure. Local anesthesia was provided with 1% Lidocaine. After small skin incision, a 21 gauge needle was advanced into the right common femoral artery under direct ultrasound guidance. Ultrasound image documentation was performed. After establishing guide wire access, a 5-French sheath was placed. A 5 Fr diagnostic catheter was advanced over a guidewire into the distal abdominal aorta. The catheter was then used to selectively catheterize the right common iliac artery followed by the right internal iliac artery. Selective arteriography of the right internal iliac artery was performed. A 2.8 Fr coaxial microcatheter was then introduced through the diagnostic catheter and advanced into the right  uterine artery over a guide wire utilizing road mapping technique. Selective arteriography of the right uterine artery was performed through the microcatheter. Right uterine artery embolization was then performed with installation of microsphere particles. Follow-up arteriography was performed after embolization. The microcatheter was removed. The diagnostic catheter was then retracted and used to selectively catheterize the left internal iliac artery. Selective arteriography of the left internal iliac artery was performed. The microcatheter was then reintroduced and advanced into the left uterine artery over a guidewire utilizing road mapping technique. Selective arteriography of the left uterine artery was then performed through the microcatheter. Left uterine artery embolization was then performed with installation of microsphere particles. Follow-up arteriography was performed after embolization. Right femoral arteriotomy hemostasis: Cordis ExoSeal COMPLICATIONS: None. FINDINGS: Selective internal iliac arteriography bilaterally was able to define bilateral uterine arteries. Bilateral uterine arteriography shows multiple enlarged hypervascular trunks supplying uterine fibroids. Right uterine artery embolization was performed utilizing 1-2/3 vials of 500-700 micron sized Embosphere particles. Completion arteriography demonstrates adequate occlusion of branches supplying uterine fibroids. Left uterine artery embolization was performed utilizing 1-1/3 vials of 500-700 micron sized Embosphere particles. Completion arteriography demonstrates adequate occlusion of branches supplying uterine fibroids. Adequate hemostasis was achieved at the femoral arteriotomy site. IMPRESSION: Successful bilateral uterine artery embolization to treat symptomatic uterine fibroid disease. Adequate occlusion of branch vessels supplying uterine fibroids was achieved with microsphere particle embolization. The patient was admitted for  overnight observation for treatment of post embolization symptoms. Electronically Signed   By: Aletta Edouard M.D.   On: 06/18/2019 17:28   Ir US Guide Vasc Access Right  Result Date: 06/18/2019 CLINICAL DATA:  Symptomatic uterine fibroids causing severe menorrhagia and anemia. The patient presents for uterine fibroid embolization. EXAM: 1. ULTRASOUND GUIDANCE FOR VASCULAR ACCESS OF THE RIGHT COMMON FEMORAL ARTERY 2. SELECTIVE ARTERIOGRAPHY OF THE RIGHT INTERNAL ILIAC ARTERY 3. SELECTIVE ARTERIOGRAPHY OF THE RIGHT UTERINE ARTERY 4. TRANSCATHETER EMBOLIZATION OF THE RIGHT UTERINE ARTERY 5. SELECTIVE ARTERIOGRAPHY OF THE LEFT INTERNAL ILIAC ARTERY 6. SELECTIVE ARTERIOGRAPHY OF THE LEFT UTERINE ARTERY 7. TRANSCATHETER EMBOLIZATION OF THE LEFT UTERINE ARTERY ANESTHESIA/SEDATION: Moderate (conscious) sedation was employed during this procedure. A total of Versed 6.0 mg and Fentanyl 300 mcg was administered intravenously. Moderate Sedation Time: 100 minutes. The patient's level of consciousness and vital signs were monitored continuously by radiology nursing throughout the procedure under my direct supervision. MEDICATIONS: Additional Medications: 4 mg IV Zofran, 1 mg IV Dilaudid FLUOROSCOPY  TIME:  36 minutes.  3520 mGy CONTRAST:  129 mL Omnipaque 300 PROCEDURE: The procedure, risks, benefits, and alternatives were explained to the patient. Questions regarding the procedure were encouraged and answered. The patient understands and consents to the procedure. A time-out was performed prior to initiating the procedure. The right groin was prepped with chlorhexidine in a sterile fashion, and a sterile drape was applied covering the operative field. A sterile gown and sterile gloves were used for the procedure. Local anesthesia was provided with 1% Lidocaine. After small skin incision, a 21 gauge needle was advanced into the right common femoral artery under direct ultrasound guidance. Ultrasound image documentation was  performed. After establishing guide wire access, a 5-French sheath was placed. A 5 Fr diagnostic catheter was advanced over a guidewire into the distal abdominal aorta. The catheter was then used to selectively catheterize the right common iliac artery followed by the right internal iliac artery. Selective arteriography of the right internal iliac artery was performed. A 2.8 Fr coaxial microcatheter was then introduced through the diagnostic catheter and advanced into the right uterine artery over a guide wire utilizing road mapping technique. Selective arteriography of the right uterine artery was performed through the microcatheter. Right uterine artery embolization was then performed with installation of microsphere particles. Follow-up arteriography was performed after embolization. The microcatheter was removed. The diagnostic catheter was then retracted and used to selectively catheterize the left internal iliac artery. Selective arteriography of the left internal iliac artery was performed. The microcatheter was then reintroduced and advanced into the left uterine artery over a guidewire utilizing road mapping technique. Selective arteriography of the left uterine artery was then performed through the microcatheter. Left uterine artery embolization was then performed with installation of microsphere particles. Follow-up arteriography was performed after embolization. Right femoral arteriotomy hemostasis: Cordis ExoSeal COMPLICATIONS: None. FINDINGS: Selective internal iliac arteriography bilaterally was able to define bilateral uterine arteries. Bilateral uterine arteriography shows multiple enlarged hypervascular trunks supplying uterine fibroids. Right uterine artery embolization was performed utilizing 1-2/3 vials of 500-700 micron sized Embosphere particles. Completion arteriography demonstrates adequate occlusion of branches supplying uterine fibroids. Left uterine artery embolization was performed  utilizing 1-1/3 vials of 500-700 micron sized Embosphere particles. Completion arteriography demonstrates adequate occlusion of branches supplying uterine fibroids. Adequate hemostasis was achieved at the femoral arteriotomy site. IMPRESSION: Successful bilateral uterine artery embolization to treat symptomatic uterine fibroid disease. Adequate occlusion of branch vessels supplying uterine fibroids was achieved with microsphere particle embolization. The patient was admitted for overnight observation for treatment of post embolization symptoms. Electronically Signed   By: Aletta Edouard M.D.   On: 06/18/2019 17:28   Ir Embo Tumor Organ Ischemia Infarct Inc Guide Roadmapping  Result Date: 06/18/2019 CLINICAL DATA:  Symptomatic uterine fibroids causing severe menorrhagia and anemia. The patient presents for uterine fibroid embolization. EXAM: 1. ULTRASOUND GUIDANCE FOR VASCULAR ACCESS OF THE RIGHT COMMON FEMORAL ARTERY 2. SELECTIVE ARTERIOGRAPHY OF THE RIGHT INTERNAL ILIAC ARTERY 3. SELECTIVE ARTERIOGRAPHY OF THE RIGHT UTERINE ARTERY 4. TRANSCATHETER EMBOLIZATION OF THE RIGHT UTERINE ARTERY 5. SELECTIVE ARTERIOGRAPHY OF THE LEFT INTERNAL ILIAC ARTERY 6. SELECTIVE ARTERIOGRAPHY OF THE LEFT UTERINE ARTERY 7. TRANSCATHETER EMBOLIZATION OF THE LEFT UTERINE ARTERY ANESTHESIA/SEDATION: Moderate (conscious) sedation was employed during this procedure. A total of Versed 6.0 mg and Fentanyl 300 mcg was administered intravenously. Moderate Sedation Time: 100 minutes. The patient's level of consciousness and vital signs were monitored continuously by radiology nursing throughout the procedure under my direct supervision. MEDICATIONS: Additional Medications: 4  mg IV Zofran, 1 mg IV Dilaudid FLUOROSCOPY TIME:  36 minutes.  3520 mGy CONTRAST:  129 mL Omnipaque 300 PROCEDURE: The procedure, risks, benefits, and alternatives were explained to the patient. Questions regarding the procedure were encouraged and answered. The patient  understands and consents to the procedure. A time-out was performed prior to initiating the procedure. The right groin was prepped with chlorhexidine in a sterile fashion, and a sterile drape was applied covering the operative field. A sterile gown and sterile gloves were used for the procedure. Local anesthesia was provided with 1% Lidocaine. After small skin incision, a 21 gauge needle was advanced into the right common femoral artery under direct ultrasound guidance. Ultrasound image documentation was performed. After establishing guide wire access, a 5-French sheath was placed. A 5 Fr diagnostic catheter was advanced over a guidewire into the distal abdominal aorta. The catheter was then used to selectively catheterize the right common iliac artery followed by the right internal iliac artery. Selective arteriography of the right internal iliac artery was performed. A 2.8 Fr coaxial microcatheter was then introduced through the diagnostic catheter and advanced into the right uterine artery over a guide wire utilizing road mapping technique. Selective arteriography of the right uterine artery was performed through the microcatheter. Right uterine artery embolization was then performed with installation of microsphere particles. Follow-up arteriography was performed after embolization. The microcatheter was removed. The diagnostic catheter was then retracted and used to selectively catheterize the left internal iliac artery. Selective arteriography of the left internal iliac artery was performed. The microcatheter was then reintroduced and advanced into the left uterine artery over a guidewire utilizing road mapping technique. Selective arteriography of the left uterine artery was then performed through the microcatheter. Left uterine artery embolization was then performed with installation of microsphere particles. Follow-up arteriography was performed after embolization. Right femoral arteriotomy hemostasis:  Cordis ExoSeal COMPLICATIONS: None. FINDINGS: Selective internal iliac arteriography bilaterally was able to define bilateral uterine arteries. Bilateral uterine arteriography shows multiple enlarged hypervascular trunks supplying uterine fibroids. Right uterine artery embolization was performed utilizing 1-2/3 vials of 500-700 micron sized Embosphere particles. Completion arteriography demonstrates adequate occlusion of branches supplying uterine fibroids. Left uterine artery embolization was performed utilizing 1-1/3 vials of 500-700 micron sized Embosphere particles. Completion arteriography demonstrates adequate occlusion of branches supplying uterine fibroids. Adequate hemostasis was achieved at the femoral arteriotomy site. IMPRESSION: Successful bilateral uterine artery embolization to treat symptomatic uterine fibroid disease. Adequate occlusion of branch vessels supplying uterine fibroids was achieved with microsphere particle embolization. The patient was admitted for overnight observation for treatment of post embolization symptoms. Electronically Signed   By: Aletta Edouard M.D.   On: 06/18/2019 17:28    Labs:  CBC: Recent Labs    12/19/18 0947 06/18/19 1309 06/19/19 0423 06/23/19 2101  WBC 5.3 3.6* 7.9 9.0  HGB 8.9* 10.0* 11.2* 10.4*  HCT 30.8* 34.7* 39.1 35.4*  PLT 321 265 284 250    COAGS: Recent Labs    06/18/19 1309  INR 0.9    BMP: Recent Labs    11/12/18 0056 12/19/18 0947 06/18/19 1309 06/23/19 2101  NA 137 138 140 135  K 3.8 4.0 3.9 3.5  CL 104 103 106 100  CO2 23 20 22 24   GLUCOSE 103* 77 91 109*  BUN 6 9 9 6   CALCIUM 8.6* 9.1 8.9 9.1  CREATININE 0.70 0.68 0.68 0.59  GFRNONAA >60 110 >60 >60  GFRAA >60 127 >60 >60    LIVER FUNCTION TESTS:  Recent Labs    06/23/19 2101  BILITOT 0.8  AST 21  ALT 26  ALKPHOS 70  PROT 8.3*  ALBUMIN 3.8    Assessment and Plan:  Jordan Pruitt is doing quite well following fibroid embolization and has started her  first menstrual cycle following the procedure.  She is already noticing significant improvement in menorrhagia.  She has lost weight and subjectively feels more energetic.  She is continuing to take iron supplementation.  I recommended that she continue her routine gynecologic care and primary care.  I recommended a follow-up MRI of the pelvis around next May in order to assess degree of fibroid and uterine volume reduction after embolization.  Electronically Signed: Azzie Roup 07/17/2019, 3:09 PM     I spent a total of 10 Minutes in remote  clinical consultation, greater than 50% of which was counseling/coordinating care post uterine fibroid embolization.    Visit type: Audio only (telephone). Audio (no video) only due to patient's lack of internet/smartphone capability. Alternative for in-person consultation at [facility], [address] [place], [place], [place]. This visit type was conducted due to national recommendations for restrictions regarding the COVID-19 Pandemic (e.g. social distancing).  This format is felt to be most appropriate for this patient at this time.  All issues noted in this document were discussed and addressed.

## 2019-10-05 ENCOUNTER — Ambulatory Visit (HOSPITAL_COMMUNITY)
Admission: EM | Admit: 2019-10-05 | Discharge: 2019-10-05 | Disposition: A | Discharge status: Home / Self Care | Payer: Self-pay | Attending: Urgent Care | Admitting: Urgent Care

## 2019-10-05 ENCOUNTER — Encounter (HOSPITAL_COMMUNITY): Payer: Self-pay

## 2019-10-05 ENCOUNTER — Other Ambulatory Visit: Payer: Self-pay

## 2019-10-05 DIAGNOSIS — M25522 Pain in left elbow: Secondary | ICD-10-CM

## 2019-10-05 DIAGNOSIS — M7712 Lateral epicondylitis, left elbow: Secondary | ICD-10-CM

## 2019-10-05 MED ORDER — PREDNISONE 20 MG PO TABS: ORAL_TABLET | ORAL | 0 refills | Status: DC

## 2019-10-05 NOTE — ED Triage Notes (Signed)
Pt presents to [facility] w/ c/o left arm pain on inside of elbow x1 week that sometimes radiates to left upper arm. Pt states she did not have an injury to arm. Pt can bend elbow but states she feels that left arm is weaker than right. Pt is left-handed.

## 2019-10-05 NOTE — ED Provider Notes (Signed)
[place]   MRN: [id number] DOB: 11-22-1975  Subjective:   Jordan Pruitt is a 43 y.o. female presenting for 1 week history of cute onset worsening left elbow pain and swelling.  Symptoms have been significantly worse and are now moderate and severe in the past 3 days.  She has extreme difficulty with bending, rotating, twisting at the level of her left forearm.  She tried naproxen which helped a little bit.  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  No current facility-administered medications for this encounter.   Current Outpatient Medications:  .  ferrous sulfate 325 (65 FE) MG EC tablet, Take 1 tablet (325 mg total) by mouth 3 (three) times daily with meals., Disp: 90 tablet, Rfl: 3 .  Multiple Vitamins-Calcium (ONE-A-DAY WOMENS FORMULA PO), Take by mouth., Disp: , Rfl:  .  naproxen (NAPROSYN) 500 MG tablet, Take 1 tablet (500 mg total) by mouth 2 (two) times daily with a meal., Disp: 20 tablet, Rfl: 0 .  oxyCODONE-acetaminophen (PERCOCET/ROXICET) 5-325 MG tablet, Take 1 tablet by mouth every 6 (six) hours as needed for severe pain., Disp: 8 tablet, Rfl: 0 .  promethazine (PHENERGAN) 25 MG tablet, Take 1 tablet (25 mg total) by mouth every 8 (eight) hours as needed for nausea., Disp: 30 tablet, Rfl: 0   Allergies  Allergen Reactions  . Ibuprofen Itching    Itching w/ Rx 800mg ; able to take lower OTC doses without any problems    Past Medical History:  Diagnosis Date  . Anemia   . Fibroids   . Menorrhagia   . Obesity   . Paronychia      Past Surgical History:  Procedure Laterality Date  . CESAREAN SECTION     x4  . IR ANGIOGRAM PELVIS SELECTIVE OR SUPRASELECTIVE  06/18/2019  . IR ANGIOGRAM PELVIS SELECTIVE OR SUPRASELECTIVE  06/18/2019  . IR ANGIOGRAM SELECTIVE EACH ADDITIONAL VESSEL  06/18/2019  . IR ANGIOGRAM SELECTIVE EACH ADDITIONAL VESSEL  06/18/2019  . IR EMBO TUMOR ORGAN ISCHEMIA INFARCT INC GUIDE  ROADMAPPING  06/18/2019  . IR RADIOLOGIST EVAL & MGMT  01/18/2019  . IR RADIOLOGIST EVAL & MGMT  07/17/2019  . IR US GUIDE VASC ACCESS RIGHT  06/18/2019  . TUBAL LIGATION      Family History  Problem Relation Age of Onset  . Hypertension Father   . Hypertension Mother   . Diabetes Mother   . Diabetes Maternal Aunt     Social History   Tobacco Use  . Smoking status: Former Smoker    Packs/day: 0.50    Types: Cigarettes    Quit date: 2018    Years since quitting: 2.8  . Smokeless tobacco: Never Used  Substance Use Topics  . Alcohol use: Yes  . Drug use: No    ROS   Objective:   Vitals: BP (!) 156/97 (BP Location: Right Arm)   Pulse 80   Temp 98.5 F (36.9 C) (Oral)   SpO2 100%   Physical Exam Constitutional:      General: She is not in acute distress.    Appearance: Normal appearance. She is well-developed. She is not ill-appearing.  HENT:     Head: Normocephalic and atraumatic.     Nose: Nose normal.     Mouth/Throat:     Mouth: Mucous membranes are moist.     Pharynx: Oropharynx is clear.  Eyes:     General: No scleral icterus.  Extraocular Movements: Extraocular movements intact.     Pupils: Pupils are equal, round, and reactive to light.  Cardiovascular:     Rate and Rhythm: Normal rate.  Pulmonary:     Effort: Pulmonary effort is normal.  Musculoskeletal:     Left elbow: She exhibits decreased range of motion. Tenderness found. Lateral epicondyle (Worse with resisted pronation and supination of hand) tenderness noted.  Skin:    General: Skin is warm and dry.  Neurological:     General: No focal deficit present.     Mental Status: She is alert and oriented to person, place, and time.  Psychiatric:        Mood and Affect: Mood normal.        Behavior: Behavior normal.      Assessment and Plan :   1. Lateral epicondylitis of left elbow   2. Left elbow pain     We will use aggressive management with prednisone course and work restrictions given  severity of patient's symptoms.  Recommend that she contact Ortho for further follow-up. Counseled patient on potential for adverse effects with medications prescribed/recommended today, ER and return-to-clinic precautions discussed, patient verbalized understanding.    Jaynee Eagles, PA-C 10/05/19 1702

## 2020-03-10 ENCOUNTER — Other Ambulatory Visit: Payer: Self-pay | Admitting: Interventional Radiology

## 2020-03-10 DIAGNOSIS — D250 Submucous leiomyoma of uterus: Secondary | ICD-10-CM

## 2020-05-28 ENCOUNTER — Telehealth: Payer: Self-pay

## 2020-05-28 NOTE — Telephone Encounter (Signed)
LMOM for pt to return call multiple times to schedule f/u appt from [place] w/ no return call.

## 2021-08-25 ENCOUNTER — Encounter (HOSPITAL_COMMUNITY): Payer: Self-pay | Admitting: Emergency Medicine

## 2021-08-25 ENCOUNTER — Ambulatory Visit (HOSPITAL_COMMUNITY)
Admission: EM | Admit: 2021-08-25 | Discharge: 2021-08-25 | Disposition: A | Discharge status: Home / Self Care | Payer: Medicaid Other

## 2021-08-25 ENCOUNTER — Other Ambulatory Visit: Payer: Self-pay

## 2021-08-25 DIAGNOSIS — J069 Acute upper respiratory infection, unspecified: Secondary | ICD-10-CM

## 2021-08-25 DIAGNOSIS — I10 Essential (primary) hypertension: Secondary | ICD-10-CM

## 2021-08-25 DIAGNOSIS — Z20822 Contact with and (suspected) exposure to covid-19: Secondary | ICD-10-CM | POA: Insufficient documentation

## 2021-08-25 DIAGNOSIS — R062 Wheezing: Secondary | ICD-10-CM

## 2021-08-25 DIAGNOSIS — R030 Elevated blood-pressure reading, without diagnosis of hypertension: Secondary | ICD-10-CM

## 2021-08-25 MED ORDER — BENZONATATE 100 MG PO CAPS: 100.0000 mg | ORAL_CAPSULE | Freq: Three times a day (TID) | ORAL | 0 refills | Status: AC | PRN

## 2021-08-25 MED ORDER — ALBUTEROL SULFATE HFA 108 (90 BASE) MCG/ACT IN AERS: 1.0000 | INHALATION_SPRAY | Freq: Four times a day (QID) | RESPIRATORY_TRACT | 0 refills | Status: AC | PRN

## 2021-08-25 MED ORDER — LOSARTAN POTASSIUM 50 MG PO TABS: 50.0000 mg | ORAL_TABLET | Freq: Every day | ORAL | 0 refills | Status: DC

## 2021-08-25 MED ORDER — PROMETHAZINE-DM 6.25-15 MG/5ML PO SYRP: 5.0000 mL | ORAL_SOLUTION | Freq: Every evening | ORAL | 0 refills | Status: AC | PRN

## 2021-08-25 NOTE — Discharge Instructions (Addendum)
We will notify you of your COVID-19 test results as they arrive and may take between 48-72 hours.  I encourage you to sign up for MyChart if you have not already done so as this can be the easiest way for [place] to communicate results to you online or through a phone app.  Generally, we only contact you if it is a positive COVID result.  In the meantime, if you develop worsening symptoms including fever, chest pain, shortness of breath despite our current treatment plan then please report to the emergency room as this may be a sign of worsening status from possible COVID-19 infection.  Otherwise, we will manage this as a viral syndrome. For sore throat or cough try using a honey-based tea. Use 3 teaspoons of honey with juice squeezed from half lemon. Place shaved pieces of ginger into 1/2-1 cup of water and warm over stove top. Then mix the ingredients and repeat every 4 hours as needed. Please take Tylenol 500mg -650mg  every 6 hours for aches and pains, fevers. Hydrate very well with at least 2 liters of water. Eat light meals such as soups to replenish electrolytes and soft fruits, veggies. Start an antihistamine like Zyrtec for postnasal drainage, sinus congestion.   For diabetes or elevated blood sugar, please make sure you are limiting and avoiding starchy, carbohydrate foods like pasta, breads, sweet breads, pastry, rice, potatoes, desserts. These foods can elevate your blood sugar. Also, limit and avoid drinks that contain a lot of sugar such as sodas, sweet teas, fruit juices.  Drinking plain water will be much more helpful, try 64 ounces of water daily.  It is okay to flavor your water naturally by cutting cucumber, lemon, mint or lime, placing it in a picture with water and drinking it over a period of 24-48 hours as long as it remains refrigerated.  For elevated blood pressure, make sure you are monitoring salt in your diet.  Do not eat restaurant foods and limit processed foods at home. I highly  recommend you prepare and cook your own foods at home.  Processed foods include things like frozen meals, pre-seasoned meats and dinners, deli meats, canned foods as these foods contain a high amount of sodium/salt.  Make sure you are paying attention to sodium labels on foods you buy at the grocery store. Buy your spices separately such as garlic powder, onion powder, cumin, cayenne, parsley flakes so that you can avoid seasonings that contain salt. However, salt-free seasonings are available and can be used, an example is Mrs. Dash and includes a lot of different mixtures that do not contain salt.  Lastly, when cooking using oils that are healthier for you is important. This includes olive oil, avocado oil, canola oil. We have discussed a lot of foods to avoid but below is a list of foods that can be very healthy to use in your diet whether it is for diabetes, cholesterol, high blood pressure, or in general healthy eating.  Salads - kale, spinach, cabbage, spring mix, arugula Fruits - avocadoes, berries (blueberries, raspberries, blackberries), apples, oranges, pomegranate, grapefruit, kiwi Vegetables - asparagus, cauliflower, broccoli, green beans, brussel sprouts, bell peppers, beets; stay away from or limit starchy vegetables like potatoes, carrots, peas Other general foods - kidney beans, egg whites, almonds, walnuts, sunflower seeds, pumpkin seeds, fat free yogurt, almond milk, flax seeds, quinoa, oats  Meat - It is better to eat lean meats and limit your red meat including pork to once a week.  Wild caught fish,  chicken breast are good options as they tend to be leaner sources of good protein. Still be mindful of the sodium labels for the meats you buy.  DO NOT EAT ANY FOODS ON THIS LIST THAT YOU ARE ALLERGIC TO. For more specific needs, I highly recommend consulting a dietician or nutritionist but this can definitely be a good starting point.

## 2021-08-25 NOTE — ED Provider Notes (Signed)
[place]   MRN: [id number] DOB: September 21, 1976  Subjective:   Jordan Pruitt is a 45 y.o. female presenting for sinus congestion, coughing, stomach soreness from coughing. Denies fever, chest pain, shob, wheezing. She is smoking again. Had quit for a few years. No history of asthma or COPD. Has not had HTN diagnosis but extensive family history of this. No weakness, confusion, numbness or tingling.   No current facility-administered medications for this encounter.  Current Outpatient Medications:    ferrous sulfate 325 (65 FE) MG EC tablet, Take 1 tablet (325 mg total) by mouth 3 (three) times daily with meals., Disp: 90 tablet, Rfl: 3   Multiple Vitamins-Calcium (ONE-A-DAY WOMENS FORMULA PO), Take by mouth., Disp: , Rfl:    Allergies  Allergen Reactions   Ibuprofen Itching    Itching w/ Rx 800mg ; able to take lower OTC doses without any problems    Past Medical History:  Diagnosis Date   Anemia    Fibroids    Menorrhagia    Obesity    Paronychia      Past Surgical History:  Procedure Laterality Date   CESAREAN SECTION     x4   IR ANGIOGRAM PELVIS SELECTIVE OR SUPRASELECTIVE  06/18/2019   IR ANGIOGRAM PELVIS SELECTIVE OR SUPRASELECTIVE  06/18/2019   IR ANGIOGRAM SELECTIVE EACH ADDITIONAL VESSEL  06/18/2019   IR ANGIOGRAM SELECTIVE EACH ADDITIONAL VESSEL  06/18/2019   IR EMBO TUMOR ORGAN ISCHEMIA INFARCT INC GUIDE ROADMAPPING  06/18/2019   IR RADIOLOGIST EVAL & MGMT  01/18/2019   IR RADIOLOGIST EVAL & MGMT  07/17/2019   IR US GUIDE VASC ACCESS RIGHT  06/18/2019   TUBAL LIGATION      Family History  Problem Relation Age of Onset   Hypertension Mother    Diabetes Mother    Hypertension Father    Diabetes Maternal Aunt     Social History   Tobacco Use   Smoking status: Former    Packs/day: 0.50    Types: Cigarettes    Quit date: 2018    Years since quitting: 4.7   Smokeless tobacco: Never  Vaping Use   Vaping Use: Former  Substance Use Topics   Alcohol  use: Yes   Drug use: No    ROS   Objective:   Vitals: BP (!) 170/104 (BP Location: Right Arm) Comment: has had cough /cold medicines Comment (BP Location): large cuff  Pulse 99   Temp 99.8 F (37.7 C) (Oral)   Resp (!) 22   SpO2 95%   BP Readings from Last 3 Encounters:  08/25/21 (!) 170/104  10/05/19 (!) 156/97  06/24/19 (!) 155/91   Physical Exam Constitutional:      General: She is not in acute distress.    Appearance: Normal appearance. She is well-developed. She is not ill-appearing, toxic-appearing or diaphoretic.  HENT:     Head: Normocephalic and atraumatic.     Right Ear: Tympanic membrane, ear canal and external ear normal. No drainage or tenderness. No middle ear effusion. Tympanic membrane is not erythematous.     Left Ear: Tympanic membrane, ear canal and external ear normal. No drainage or tenderness.  No middle ear effusion. Tympanic membrane is not erythematous.     Nose: Nose normal. No congestion or rhinorrhea.     Mouth/Throat:     Mouth: Mucous membranes are moist. No oral lesions.     Pharynx: No pharyngeal swelling, oropharyngeal exudate, posterior oropharyngeal erythema or uvula swelling.  Tonsils: No tonsillar exudate or tonsillar abscesses.  Eyes:     Extraocular Movements: Extraocular movements intact.     Right eye: Normal extraocular motion.     Left eye: Normal extraocular motion.     Conjunctiva/sclera: Conjunctivae normal.     Pupils: Pupils are equal, round, and reactive to light.  Cardiovascular:     Rate and Rhythm: Normal rate and regular rhythm.     Pulses: Normal pulses.     Heart sounds: Normal heart sounds. No murmur heard.   No friction rub. No gallop.  Pulmonary:     Effort: Pulmonary effort is normal. No respiratory distress.     Breath sounds: Normal breath sounds. No stridor. No wheezing, rhonchi or rales.  Musculoskeletal:     Cervical back: Normal range of motion and neck supple.  Lymphadenopathy:     Cervical: No  cervical adenopathy.  Skin:    General: Skin is warm and dry.     Findings: No rash.  Neurological:     General: No focal deficit present.     Mental Status: She is alert and oriented to person, place, and time.     Cranial Nerves: No cranial nerve deficit.     Motor: No weakness.     Coordination: Coordination normal.     Gait: Gait normal.     Deep Tendon Reflexes: Reflexes normal.     Comments: Negative Romberg and pronator drift.   Psychiatric:        Mood and Affect: Mood normal.        Behavior: Behavior normal.        Thought Content: Thought content normal.        Judgment: Judgment normal.    Assessment and Plan :   PDMP not reviewed this encounter.  1. Viral URI with cough   2. Wheezing   3. Essential hypertension   4. Elevated blood pressure reading     Will manage for viral illness such as viral URI, viral syndrome, viral rhinitis, COVID-19. Counseled patient on nature of COVID-19 including modes of transmission, diagnostic testing, management and supportive care.  Offered scripts for symptomatic relief. COVID 19 testing is pending.  Counseled on general management of her hypertension, start losartan.  Establish care with a new PCP.  Given clear cardiopulmonary exam, deferred imaging.  No signs of an acute encephalopathy.  Counseled patient on potential for adverse effects with medications prescribed/recommended today, ER and return-to-clinic precautions discussed, patient verbalized understanding.     Jaynee Eagles, PA-C 08/25/21 1606

## 2021-08-25 NOTE — ED Triage Notes (Signed)
3 day history of uri symptoms.  Patient has a family member with similar symptoms.  Patient complains of cough, fever, ribs and stomach soreness when coughing.  No appetite.  Patient reports an episode of vomiting last night post coughing.

## 2021-08-26 LAB — SARS CORONAVIRUS 2 (TAT 6-24 HRS): SARS Coronavirus 2: NEGATIVE

## 2022-11-02 ENCOUNTER — Encounter (HOSPITAL_COMMUNITY): Payer: Self-pay | Admitting: Emergency Medicine

## 2022-11-02 ENCOUNTER — Ambulatory Visit (HOSPITAL_COMMUNITY)
Admission: EM | Admit: 2022-11-02 | Discharge: 2022-11-02 | Disposition: A | Discharge status: Home / Self Care | Payer: Medicaid Other | Attending: Internal Medicine | Admitting: Internal Medicine

## 2022-11-02 DIAGNOSIS — I10 Essential (primary) hypertension: Secondary | ICD-10-CM

## 2022-11-02 DIAGNOSIS — R110 Nausea: Secondary | ICD-10-CM

## 2022-11-02 DIAGNOSIS — R519 Headache, unspecified: Secondary | ICD-10-CM

## 2022-11-02 DIAGNOSIS — R030 Elevated blood-pressure reading, without diagnosis of hypertension: Secondary | ICD-10-CM

## 2022-11-02 MED ORDER — ACETAMINOPHEN 325 MG PO TABS
975.0000 mg | ORAL_TABLET | Freq: Once | ORAL | Status: AC
  Administered 2022-11-02: 975 mg via ORAL

## 2022-11-02 MED ORDER — AMLODIPINE BESYLATE 5 MG PO TABS: 5.0000 mg | ORAL_TABLET | Freq: Every day | ORAL | 0 refills | Status: AC

## 2022-11-02 MED ORDER — DEXAMETHASONE SODIUM PHOSPHATE 10 MG/ML IJ SOLN
10.0000 mg | Freq: Once | INTRAMUSCULAR | Status: AC
  Administered 2022-11-02: 10 mg via INTRAMUSCULAR

## 2022-11-02 MED ORDER — ACETAMINOPHEN 325 MG PO TABS
ORAL_TABLET | ORAL | Status: AC
  Filled 2022-11-02: qty 3

## 2022-11-02 MED ORDER — CLONIDINE HCL 0.1 MG PO TABS
0.1000 mg | ORAL_TABLET | Freq: Once | ORAL | Status: AC
  Administered 2022-11-02: 0.1 mg via ORAL

## 2022-11-02 MED ORDER — METOCLOPRAMIDE HCL 5 MG/ML IJ SOLN
INTRAMUSCULAR | Status: AC
  Filled 2022-11-02: qty 2

## 2022-11-02 MED ORDER — BLOOD PRESSURE CUFF MISC: 0 refills | Status: AC

## 2022-11-02 MED ORDER — METOCLOPRAMIDE HCL 5 MG/ML IJ SOLN
5.0000 mg | Freq: Once | INTRAMUSCULAR | Status: AC
  Administered 2022-11-02: 5 mg via INTRAMUSCULAR

## 2022-11-02 MED ORDER — DEXAMETHASONE SODIUM PHOSPHATE 10 MG/ML IJ SOLN
INTRAMUSCULAR | Status: AC
  Filled 2022-11-02: qty 1

## 2022-11-02 MED ORDER — CLONIDINE HCL 0.1 MG PO TABS
ORAL_TABLET | ORAL | Status: AC
  Filled 2022-11-02: qty 1

## 2022-11-02 NOTE — ED Triage Notes (Signed)
Pt reports a headache x 4 days. States her eyes are sensitive to light and noise is intensified. Also reports having hx of HTN and states once she ran out of her medication she never got the medication refilled.

## 2022-11-02 NOTE — ED Provider Notes (Signed)
[place]    CSN: [id number] Arrival date & time: 11/02/22  1108      History   Chief Complaint Chief Complaint  Patient presents with   Headache    HPI Jordan Pruitt is a 46 y.o. female.   Patient presents to urgent care for evaluation of headache, nausea, intermittent blurred vision, photophobia, and phonophobia that started 2 days ago but worsened this morning while she was at work. Headache started 2 days ago to the left side of the head, but has now progressed to the entire head. She states it is an 8 on a scale of 0-10 and describes pain as a "throbbing and pounding" sensation. She has been using tylenol for pain at home without relief, last dose was yesterday. Feels slightly nauseous, has not vomited. Denies fever, chills, neck pain, body aches, URI symptoms, current blurry vision, spots in vision, or dizziness. No recent antibiotic or steroid use. Denies chest pain, shortness of breath, and leg swelling. She is not a diabetic, reports history of high blood pressure but has not been on medication in over 1 year. She was placed on losartan '50mg'$  BP medication by urgent care provider one year ago when she had a viral URI but never went back to get more medicine. She does not have a primary care provider, states she just got her wellness card and plans to call the PCP listed on the insurance card to schedule an appointment. BP is noticeably high at 213/96 in clinic upon arrival. Denies history of cardiac, pulmonary, or cerebrovascular problems. Denies numbness/tingling to bilateral upper and lower extremities, one-sided weakness, flank pain, urinary symptoms, recent medication changes, and recent falls. She has significantly lowered her salt intake over the last year and has stopped eating red meat. Reports increased stress at home and at work recently, believes this to be contributing to high blood pressure. Significant family history of hypertension.    Headache   Past  Medical History:  Diagnosis Date   Anemia    Fibroids    Menorrhagia    Obesity    Paronychia     Patient Active Problem List   Diagnosis Date Noted   Essential hypertension 11/02/2022   Uterine leiomyoma 06/18/2019   Menorrhagia with regular cycle 12/19/2018   Polyuria 12/19/2018   Symptomatic anemia 11/12/2018   Uterine fibroid 11/12/2018    Past Surgical History:  Procedure Laterality Date   CESAREAN SECTION     x4   IR ANGIOGRAM PELVIS SELECTIVE OR SUPRASELECTIVE  06/18/2019   IR ANGIOGRAM PELVIS SELECTIVE OR SUPRASELECTIVE  06/18/2019   IR ANGIOGRAM SELECTIVE EACH ADDITIONAL VESSEL  06/18/2019   IR ANGIOGRAM SELECTIVE EACH ADDITIONAL VESSEL  06/18/2019   IR EMBO TUMOR ORGAN ISCHEMIA INFARCT INC GUIDE ROADMAPPING  06/18/2019   IR RADIOLOGIST EVAL & MGMT  01/18/2019   IR RADIOLOGIST EVAL & MGMT  07/17/2019   IR US GUIDE VASC ACCESS RIGHT  06/18/2019   TUBAL LIGATION      OB History     Gravida  5   Para  4   Term  3   Preterm  1   AB  1   Living  4      SAB  1   IAB      Ectopic      Multiple      Live Births  4            Home Medications    Prior to Admission medications  Medication Sig Start Date End Date Taking? Authorizing Provider  albuterol (VENTOLIN HFA) 108 (90 Base) MCG/ACT inhaler Inhale 1-2 puffs into the lungs every 6 (six) hours as needed for wheezing or shortness of breath. 08/25/21   Jaynee Eagles, PA-C  benzonatate (TESSALON) 100 MG capsule Take 1-2 capsules (100-200 mg total) by mouth 3 (three) times daily as needed for cough. 08/25/21   Jaynee Eagles, PA-C  ferrous sulfate 325 (65 FE) MG EC tablet Take 1 tablet (325 mg total) by mouth 3 (three) times daily with meals. 12/19/18   Elsie Stain, MD  guaiFENesin (MUCINEX) 600 MG 12 hr tablet Take by mouth 2 (two) times daily. Daytime and night time cold products    [provider]  guaiFENesin (TUSSIN) 100 MG/5ML liquid Take 200 mg by mouth 3 (three) times daily as needed for cough.     [provider]  losartan (COZAAR) 50 MG tablet Take 1 tablet (50 mg total) by mouth daily. 08/25/21   Jaynee Eagles, PA-C  Multiple Vitamins-Calcium (ONE-A-DAY WOMENS FORMULA PO) Take by mouth.    [provider]  promethazine-dextromethorphan (PROMETHAZINE-DM) 6.25-15 MG/5ML syrup Take 5 mLs by mouth at bedtime as needed for cough. 08/25/21   Jaynee Eagles, PA-C    Family History Family History  Problem Relation Age of Onset   Hypertension Mother    Diabetes Mother    Hypertension Father    Diabetes Maternal Aunt     Social History Social History   Tobacco Use   Smoking status: Former    Packs/day: 0.50    Types: Cigarettes    Quit date: 2018    Years since quitting: 5.9   Smokeless tobacco: Never  Vaping Use   Vaping Use: Former  Substance Use Topics   Alcohol use: Yes   Drug use: No     Allergies   Ibuprofen   Review of Systems Review of Systems  Neurological:  Positive for headaches.  Per HPI   Physical Exam Triage Vital Signs ED Triage Vitals  Enc Vitals Group     BP 11/02/22 1410 (!) 213/96     Pulse Rate 11/02/22 1410 83     Resp 11/02/22 1410 18     Temp 11/02/22 1410 98.1 F (36.7 C)     Temp Source 11/02/22 1410 Oral     SpO2 11/02/22 1410 97 %     Weight --      Height --      Head Circumference --      Peak Flow --      Pain Score 11/02/22 1409 7     Pain Loc --      Pain Edu? --      Excl. in [place]? --    No data found.  Updated Vital Signs BP (!) 154/94 (BP Location: Right Arm)   Pulse 69   Temp 98.1 F (36.7 C) (Oral)   Resp 18   SpO2 98%   Vitals:   11/02/22 1410 11/02/22 1457  BP: (!) 213/96 (!) 154/94     Visual Acuity Right Eye Distance:   Left Eye Distance:   Bilateral Distance:    Right Eye Near:   Left Eye Near:    Bilateral Near:     Physical Exam Vitals and nursing note reviewed.  Constitutional:      Appearance: She is ill-appearing. She is not toxic-appearing.  HENT:     Head:  Normocephalic and atraumatic.     Comments: EOMs intact  without dizziness or pain elicited.    Right Ear: Hearing and external ear normal.     Left Ear: Hearing and external ear normal.     Nose: Nose normal.     Mouth/Throat:     Lips: Pink.     Mouth: Mucous membranes are moist.     Pharynx: Oropharynx is clear.  Eyes:     General: Lids are normal. Vision grossly intact. Gaze aligned appropriately. No visual field deficit.    Extraocular Movements: Extraocular movements intact.     Conjunctiva/sclera: Conjunctivae normal.     Pupils: Pupils are equal, round, and reactive to light.  Cardiovascular:     Rate and Rhythm: Normal rate and regular rhythm.     Heart sounds: Normal heart sounds, S1 normal and S2 normal.  Pulmonary:     Effort: Pulmonary effort is normal. No respiratory distress.     Breath sounds: Normal breath sounds and air entry.  Abdominal:     Palpations: Abdomen is soft.     Tenderness: There is no abdominal tenderness.  Musculoskeletal:        General: No swelling.     Cervical back: Normal range of motion and neck supple. No rigidity.  Lymphadenopathy:     Cervical: No cervical adenopathy.  Skin:    General: Skin is warm and dry.     Capillary Refill: Capillary refill takes less than 2 seconds.     Findings: No rash.  Neurological:     General: No focal deficit present.     Mental Status: She is alert and oriented to person, place, and time. Mental status is at baseline.     Cranial Nerves: No cranial nerve deficit, dysarthria or facial asymmetry.     Sensory: No sensory deficit.     Motor: No weakness.     Comments: Non focal neuro exam, she is intact to baseline. Strength and sensation intact to bilateral upper and lower extremities. Moves all 4 extremities with normal coordination voluntarily.   Psychiatric:        Mood and Affect: Mood normal.        Speech: Speech normal.        Behavior: Behavior normal.        Thought Content: Thought content  normal.        Judgment: Judgment normal.     UC Treatments / Results  Labs (all labs ordered are listed, but only abnormal results are displayed) Labs Reviewed - No data to display  EKG   Radiology No results found.  Procedures Procedures (including critical care time)  Medications Ordered in UC Medications  cloNIDine (CATAPRES) tablet 0.1 mg (0.1 mg Oral Given 11/02/22 1436)  acetaminophen (TYLENOL) tablet 975 mg (975 mg Oral Given 11/02/22 1436)  metoCLOPramide (REGLAN) injection 5 mg (5 mg Intramuscular Given 11/02/22 1437)  dexamethasone (DECADRON) injection 10 mg (10 mg Intramuscular Given 11/02/22 1437)    Initial Impression / Assessment and Plan / UC Course  I have reviewed the triage vital signs and the nursing notes.  Pertinent labs & imaging results that were available during my care of the patient were reviewed by me and considered in my medical decision making (see chart for details).   1. Essential hypertension, bad headache Unclear etiology of patient's headache, likely multifactorial (stress, elevated BP, etc). Patient is allergic to NSAIDs and experiences itching at high doses. Would like to give headache cocktail, will defer ketorolac injection due to allergy. Instead, will give '10mg'$  decadron,  $'5mg'K$  reglan, and 1,'000mg'$  tylenol for acute headache. Clonidine 0.'1mg'$  to lower BP. Patient placed in dark/quiet room. Medications given, vitals rechecked after 20-30 minutes. BP reduced to 150s/90s, this is significantly improved. Patient reports improvement in headache as well. Medications will continue to work in her body over the next 12-24 hours, she may take another dose of tylenol 1,'000mg'$  at 9pm tonight if needed. Work note provided. Advised patient to go home, rest, and drink plenty of fluids to stay well hydrated.   Re-started patient on amlodipine '5mg'$  today. Sent in BP cuff to pharmacy, patient to take BP once per day while starting BP medicine for 2 weeks, then 3-4  times per week. She is without signs of organ damage, BP reduced to 154/94 30 minutes after clonidine 0.'1mg'$ . Strict ER return precautions discussed. PCP follow-up advised, she plans to call the PCP phone number on her insurance card to schedule an appointment for follow-up and ongoing management of headaches as well as hypertension.   Discussed physical exam and available lab work findings in clinic with patient.  Counseled patient regarding appropriate use of medications and potential side effects for all medications recommended or prescribed today. Discussed red flag signs and symptoms of worsening condition,when to call the PCP office, return to urgent care, and when to seek higher level of care in the emergency department. Patient verbalizes understanding and agreement with plan. All questions answered. Patient discharged in stable condition.    Final Clinical Impressions(s) / UC Diagnoses   Final diagnoses:  Essential hypertension  Elevated blood pressure reading  Bad headache     Discharge Instructions      Your blood pressure was very elevated in the clinic today.   Start taking amlodipine blood pressure medicine 5 mg once daily to help gradually lower your blood pressure. Take your blood pressure once daily for the next couple of weeks while your blood pressure medicine kicks in and write these numbers down in a notebook.  After 2 weeks, you may take your blood pressure 3-4 times a week.  Bring the notebook to your primary care provider visit so that they are able to see what your blood pressures have been at home and make medical decisions based off of this data.    We gave you medicines in the clinic to help with your headache.  You may have another dose of tylenol at 9pm tonight if needed. The medicines we gave you in the clinic will continue to work in your body over the next 24 hours.   Schedule an appointment with your primary care provider in the next 1-2 weeks to have a  follow-up appointment to discuss elevated blood pressure and headaches.   If you develop any new or worsening symptoms or do not improve in the next 2 to 3 days, please return.  If your symptoms are severe, please go to the emergency room.  Follow-up with your primary care provider for further evaluation and management of your symptoms as well as ongoing wellness visits.  I hope you feel better!    ED Prescriptions   None    PDMP not reviewed this encounter.   Joella Prince [place], [place] 11/05/22 [phone]

## 2022-11-02 NOTE — Discharge Instructions (Addendum)
Your blood pressure was very elevated in the clinic today.   Start taking amlodipine blood pressure medicine 5 mg once daily to help gradually lower your blood pressure. Take your blood pressure once daily for the next couple of weeks while your blood pressure medicine kicks in and write these numbers down in a notebook.  After 2 weeks, you may take your blood pressure 3-4 times a week.  Bring the notebook to your primary care provider visit so that they are able to see what your blood pressures have been at home and make medical decisions based off of this data.    We gave you medicines in the clinic to help with your headache.  You may have another dose of tylenol at 9pm tonight if needed. The medicines we gave you in the clinic will continue to work in your body over the next 24 hours.   Schedule an appointment with your primary care provider in the next 1-2 weeks to have a follow-up appointment to discuss elevated blood pressure and headaches.   If you develop any new or worsening symptoms or do not improve in the next 2 to 3 days, please return.  If your symptoms are severe, please go to the emergency room.  Follow-up with your primary care provider for further evaluation and management of your symptoms as well as ongoing wellness visits.  I hope you feel better!

## 2023-06-15 ENCOUNTER — Ambulatory Visit (HOSPITAL_COMMUNITY)
Admission: EM | Admit: 2023-06-15 | Discharge: 2023-06-15 | Disposition: A | Discharge status: Home / Self Care | Payer: No Typology Code available for payment source | Attending: Emergency Medicine | Admitting: Emergency Medicine

## 2023-06-15 ENCOUNTER — Encounter (HOSPITAL_COMMUNITY): Payer: Self-pay

## 2023-06-15 DIAGNOSIS — J069 Acute upper respiratory infection, unspecified: Secondary | ICD-10-CM | POA: Diagnosis not present

## 2023-06-15 DIAGNOSIS — Z20822 Contact with and (suspected) exposure to covid-19: Secondary | ICD-10-CM | POA: Insufficient documentation

## 2023-06-15 LAB — SARS CORONAVIRUS 2 (TAT 6-24 HRS): SARS Coronavirus 2: POSITIVE — AB

## 2023-06-15 NOTE — ED Provider Notes (Signed)
[facility]    CSN: [id number] Arrival date & time: 06/15/23  0859      History   Chief Complaint Chief Complaint  Patient presents with   Cough   Chills    HPI Jordan Pruitt is a 47 y.o. female.   Patient presents to clinic with complaints of chills, runny nose, fatigue, body aches and head congestion for the past few days.  She took a trip this weekend with her sister, her sister started to feel unwell and tested positive for COVID on Sunday.  Patient's husband tested positive for COVID 19 in clinic yesterday.  She did do an at-home test that was negative yesterday.  Presents to clinic requesting COVID-19 testing today.  She denies wheezing, chest pain or shortness of breath.   She is a daily marijuana smoker, no hx of asthma or respiratory illnesses.     The history is provided by the patient and medical records.  Cough Associated symptoms: chills and rhinorrhea   Associated symptoms: no chest pain, no shortness of breath and no wheezing     Past Medical History:  Diagnosis Date   Anemia    Fibroids    Menorrhagia    Obesity    Paronychia     Patient Active Problem List   Diagnosis Date Noted   Essential hypertension 11/02/2022   Uterine leiomyoma 06/18/2019   Menorrhagia with regular cycle 12/19/2018   Polyuria 12/19/2018   Symptomatic anemia 11/12/2018   Uterine fibroid 11/12/2018    Past Surgical History:  Procedure Laterality Date   CESAREAN SECTION     x4   IR ANGIOGRAM PELVIS SELECTIVE OR SUPRASELECTIVE  06/18/2019   IR ANGIOGRAM PELVIS SELECTIVE OR SUPRASELECTIVE  06/18/2019   IR ANGIOGRAM SELECTIVE EACH ADDITIONAL VESSEL  06/18/2019   IR ANGIOGRAM SELECTIVE EACH ADDITIONAL VESSEL  06/18/2019   IR EMBO TUMOR ORGAN ISCHEMIA INFARCT INC GUIDE ROADMAPPING  06/18/2019   IR RADIOLOGIST EVAL & MGMT  01/18/2019   IR RADIOLOGIST EVAL & MGMT  07/17/2019   IR US GUIDE VASC ACCESS RIGHT  06/18/2019   TUBAL LIGATION      OB History     Gravida  5    Para  4   Term  3   Preterm  1   AB  1   Living  4      SAB  1   IAB      Ectopic      Multiple      Live Births  4            Home Medications    Prior to Admission medications   Medication Sig Start Date End Date Taking? Authorizing Provider  albuterol (VENTOLIN HFA) 108 (90 Base) MCG/ACT inhaler Inhale 1-2 puffs into the lungs every 6 (six) hours as needed for wheezing or shortness of breath. 08/25/21  Yes Wallis Bamberg, PA-C  amLODipine (NORVASC) 5 MG tablet Take 1 tablet (5 mg total) by mouth daily. 11/02/22  Yes Carlisle Beers, FNP  Blood Pressure Monitoring (BLOOD PRESSURE CUFF) MISC Take BP once daily while starting medicine, then 3-4 times weekly after 2 weeks of taking BP medicine. Write numbers down in notebook as discussed. 11/02/22  Yes Carlisle Beers, FNP  benzonatate (TESSALON) 100 MG capsule Take 1-2 capsules (100-200 mg total) by mouth 3 (three) times daily as needed for cough. 08/25/21   Wallis Bamberg, PA-C  ferrous sulfate 325 (65 FE) MG EC tablet Take 1 tablet (  325 mg total) by mouth 3 (three) times daily with meals. 12/19/18   Storm Frisk, MD  guaiFENesin (MUCINEX) 600 MG 12 hr tablet Take by mouth 2 (two) times daily. Daytime and night time cold products    [provider]  guaiFENesin (TUSSIN) 100 MG/5ML liquid Take 200 mg by mouth 3 (three) times daily as needed for cough.    [provider]  Multiple Vitamins-Calcium (ONE-A-DAY WOMENS FORMULA PO) Take by mouth.    [provider]  promethazine-dextromethorphan (PROMETHAZINE-DM) 6.25-15 MG/5ML syrup Take 5 mLs by mouth at bedtime as needed for cough. 08/25/21   Wallis Bamberg, PA-C    Family History Family History  Problem Relation Age of Onset   Hypertension Mother    Diabetes Mother    Hypertension Father    Diabetes Maternal Aunt     Social History Social History   Tobacco Use   Smoking status: Former    Current packs/day: 0.00    Types:  Cigarettes    Quit date: 2018    Years since quitting: 6.5   Smokeless tobacco: Never  Vaping Use   Vaping status: Former  Substance Use Topics   Alcohol use: Yes   Drug use: No     Allergies   Ibuprofen   Review of Systems Review of Systems  Constitutional:  Positive for chills and fatigue.  HENT:  Positive for congestion and rhinorrhea.   Respiratory:  Positive for cough. Negative for shortness of breath and wheezing.   Cardiovascular:  Negative for chest pain.     Physical Exam Triage Vital Signs ED Triage Vitals [06/15/23 0918]  Encounter Vitals Group     BP (!) 155/80     Systolic BP Percentile      Diastolic BP Percentile      Pulse Rate 90     Resp 16     Temp 98.4 F (36.9 C)     Temp Source Oral     SpO2 98 %     Weight      Height      Head Circumference      Peak Flow      Pain Score      Pain Loc      Pain Education      Exclude from Growth Chart    No data found.  Updated Vital Signs BP (!) 155/80 (BP Location: Left Arm)   Pulse 90   Temp 98.4 F (36.9 C) (Oral)   Resp 16   LMP 06/13/2023 (Approximate)   SpO2 98%   Visual Acuity Right Eye Distance:   Left Eye Distance:   Bilateral Distance:    Right Eye Near:   Left Eye Near:    Bilateral Near:     Physical Exam Vitals and nursing note reviewed.  Constitutional:      Appearance: Normal appearance.  HENT:     Head: Normocephalic and atraumatic.     Right Ear: External ear normal.     Left Ear: External ear normal.     Nose: Nose normal.     Mouth/Throat:     Mouth: Mucous membranes are moist.  Eyes:     General: No scleral icterus. Cardiovascular:     Rate and Rhythm: Normal rate and regular rhythm.     Heart sounds: Normal heart sounds. No murmur heard. Pulmonary:     Effort: Pulmonary effort is normal. No respiratory distress.     Breath sounds: Normal breath sounds.  Musculoskeletal:  General: Normal range of motion.     Cervical back: Normal range of motion.   Skin:    General: Skin is warm and dry.  Neurological:     General: No focal deficit present.     Mental Status: She is alert and oriented to person, place, and time.  Psychiatric:        Mood and Affect: Mood normal.        Behavior: Behavior normal. Behavior is cooperative.      UC Treatments / Results  Labs (all labs ordered are listed, but only abnormal results are displayed) Labs Reviewed  SARS CORONAVIRUS 2 (TAT 6-24 HRS)    EKG   Radiology No results found.  Procedures Procedures (including critical care time)  Medications Ordered in UC Medications - No data to display  Initial Impression / Assessment and Plan / UC Course  I have reviewed the triage vital signs and the nursing notes.  Pertinent labs & imaging results that were available during my care of the patient were reviewed by me and considered in my medical decision making (see chart for details).  Vitals in triage reviewed, patient is hemodynamically stable.  Lungs are vesicular posteriorly.  Presents to clinic requesting COVID-19 testing due to recent exposures and recent viral symptoms.  COVID-19 testing obtained, plan of care, follow-up care and return precautions given, no questions at this time.  Work note provided.     Final Clinical Impressions(s) / UC Diagnoses   Final diagnoses:  Viral URI with cough  Exposure to COVID-19 virus     Discharge Instructions      You have been exposed to COVID-19 and are having symptoms, we have tested you for COVID and will call if the results are positive.  Please ensure plenty of rest, adequate hydration with at least 64 ounces of water daily, and alternating Tylenol and ibuprofen as needed for fever, body aches and chills.  Please stay out of public and do not go to to work if you have had a fever within the last 24 hours.  Return to clinic for any new or urgent symptoms.      ED Prescriptions   None    PDMP not reviewed this encounter.    Rinaldo Ratel [place], [place] 06/15/23 [phone]

## 2023-06-15 NOTE — Discharge Instructions (Addendum)
You have been exposed to COVID-19 and are having symptoms, we have tested you for COVID and will call if the results are positive.  Please ensure plenty of rest, adequate hydration with at least 64 ounces of water daily, and alternating Tylenol and ibuprofen as needed for fever, body aches and chills.  Please stay out of public and do not go to to work if you have had a fever within the last 24 hours.  Return to clinic for any new or urgent symptoms.

## 2023-06-15 NOTE — ED Triage Notes (Signed)
Pt states she was expose to covid and would like to be tested. Pt reports stuffy nose and cough x 2 days.

## 2024-04-10 ENCOUNTER — Ambulatory Visit (INDEPENDENT_AMBULATORY_CARE_PROVIDER_SITE_OTHER)

## 2024-04-10 ENCOUNTER — Encounter (HOSPITAL_COMMUNITY): Payer: Self-pay

## 2024-04-10 ENCOUNTER — Ambulatory Visit (HOSPITAL_COMMUNITY)
Admission: EM | Admit: 2024-04-10 | Discharge: 2024-04-10 | Disposition: A | Discharge status: Home / Self Care | Attending: Internal Medicine | Admitting: Internal Medicine

## 2024-04-10 DIAGNOSIS — M25532 Pain in left wrist: Secondary | ICD-10-CM | POA: Diagnosis not present

## 2024-04-10 DIAGNOSIS — M25512 Pain in left shoulder: Secondary | ICD-10-CM

## 2024-04-10 DIAGNOSIS — M25522 Pain in left elbow: Secondary | ICD-10-CM

## 2024-04-10 DIAGNOSIS — M25531 Pain in right wrist: Secondary | ICD-10-CM | POA: Diagnosis not present

## 2024-04-10 MED ORDER — PREDNISONE 20 MG PO TABS: 40.0000 mg | ORAL_TABLET | Freq: Every day | ORAL | 0 refills | Status: AC

## 2024-04-10 MED ORDER — DEXAMETHASONE SODIUM PHOSPHATE 10 MG/ML IJ SOLN
10.0000 mg | Freq: Once | INTRAMUSCULAR | Status: AC
  Administered 2024-04-10: 10 mg via INTRAMUSCULAR

## 2024-04-10 MED ORDER — CYCLOBENZAPRINE HCL 5 MG PO TABS: 5.0000 mg | ORAL_TABLET | Freq: Three times a day (TID) | ORAL | 0 refills | Status: AC | PRN

## 2024-04-10 MED ORDER — DEXAMETHASONE SODIUM PHOSPHATE 10 MG/ML IJ SOLN
INTRAMUSCULAR | Status: AC
  Filled 2024-04-10: qty 1

## 2024-04-10 NOTE — ED Provider Notes (Addendum)
 [facility]    CSN: [id number] Arrival date & time: 04/10/24  1446      History   Chief Complaint Chief Complaint  Patient presents with   Wrist Pain   Elbow Pain    HPI Jordan Pruitt is a 48 y.o. female.   48 year old female who presents urgent care with complaints of left elbow pain with decreased motion, left shoulder pain with decreased motion with elevation and right wrist pain and hand feeling like it is locking.  She reports that this started on Sunday.  She went on vacation and drove 3 hours.  She stayed at that location for 3 to 4 days prior to driving back 3 hours on Sunday.  While she was on vacation she did no new or strenuous activity.  When she got home on Sunday she laid down to take a nap and when she woke up she was having the above symptoms.  She has not had any injuries to the areas.  She has been diagnosed with tendinitis in her left elbow in the past.  She has no history of injuries in the past.  She does relate that she gets some numbness and tingling in both hands especially along the thumb, pointer finger and ring fingers bilateral.  With her elbow it feels like she cannot fully extend it without pain.  She is able to flex it completely.  With her left shoulder she reports pain is much worse with abduction and less painful when her arm is by her side.  She does a lot with her hands especially working the [employer].  She denies any fevers or chills.   Wrist Pain Pertinent negatives include no chest pain, no abdominal pain and no shortness of breath.    Past Medical History:  Diagnosis Date   Anemia    Fibroids    Menorrhagia    Obesity    Paronychia     Patient Active Problem List   Diagnosis Date Noted   Essential hypertension 11/02/2022   Uterine leiomyoma 06/18/2019   Menorrhagia with regular cycle 12/19/2018   Polyuria 12/19/2018   Symptomatic anemia 11/12/2018   Uterine fibroid 11/12/2018    Past Surgical History:   Procedure Laterality Date   CESAREAN SECTION     x4   IR ANGIOGRAM PELVIS SELECTIVE OR SUPRASELECTIVE  06/18/2019   IR ANGIOGRAM PELVIS SELECTIVE OR SUPRASELECTIVE  06/18/2019   IR ANGIOGRAM SELECTIVE EACH ADDITIONAL VESSEL  06/18/2019   IR ANGIOGRAM SELECTIVE EACH ADDITIONAL VESSEL  06/18/2019   IR EMBO TUMOR ORGAN ISCHEMIA INFARCT INC GUIDE ROADMAPPING  06/18/2019   IR RADIOLOGIST EVAL & MGMT  01/18/2019   IR RADIOLOGIST EVAL & MGMT  07/17/2019   IR US  GUIDE VASC ACCESS RIGHT  06/18/2019   TUBAL LIGATION      OB History     Gravida  5   Para  4   Term  3   Preterm  1   AB  1   Living  4      SAB  1   IAB      Ectopic      Multiple      Live Births  4            Home Medications    Prior to Admission medications   Medication Sig Start Date End Date Taking? Authorizing Provider  cyclobenzaprine  (FLEXERIL ) 5 MG tablet Take 1 tablet (5 mg total) by mouth every 8 (eight)  hours as needed for muscle spasms. 04/10/24  Yes Leveon Pelzer A, PA-C  predniSONE  (DELTASONE ) 20 MG tablet Take 2 tablets (40 mg total) by mouth daily with breakfast for 5 days. 04/10/24 04/15/24 Yes Mc Hollen A, PA-C  albuterol  (VENTOLIN  HFA) 108 (90 Base) MCG/ACT inhaler Inhale 1-2 puffs into the lungs every 6 (six) hours as needed for wheezing or shortness of breath. 08/25/21   Adolph Hoop, PA-C  amLODipine  (NORVASC ) 5 MG tablet Take 1 tablet (5 mg total) by mouth daily. 11/02/22   Starlene Eaton, FNP  benzonatate  (TESSALON ) 100 MG capsule Take 1-2 capsules (100-200 mg total) by mouth 3 (three) times daily as needed for cough. 08/25/21   Adolph Hoop, PA-C  Blood Pressure Monitoring (BLOOD PRESSURE CUFF) MISC Take BP once daily while starting medicine, then 3-4 times weekly after 2 weeks of taking BP medicine. Write numbers down in notebook as discussed. 11/02/22   Starlene Eaton, FNP  ferrous sulfate  325 (65 FE) MG EC tablet Take 1 tablet (325 mg total) by mouth 3 (three) times daily  with meals. 12/19/18   Vernell Goldsmith, MD  guaiFENesin (MUCINEX) 600 MG 12 hr tablet Take by mouth 2 (two) times daily. Daytime and night time cold products    [provider]  guaiFENesin (TUSSIN) 100 MG/5ML liquid Take 200 mg by mouth 3 (three) times daily as needed for cough.    [provider]  Multiple Vitamins-Calcium (ONE-A-DAY WOMENS FORMULA PO) Take by mouth.    [provider]  promethazine -dextromethorphan (PROMETHAZINE -DM) 6.25-15 MG/5ML syrup Take 5 mLs by mouth at bedtime as needed for cough. 08/25/21   Adolph Hoop, PA-C    Family History Family History  Problem Relation Age of Onset   Hypertension Mother    Diabetes Mother    Hypertension Father    Diabetes Maternal Aunt     Social History Social History   Tobacco Use   Smoking status: Former    Current packs/day: 0.00    Types: Cigarettes    Quit date: 2018    Years since quitting: 7.4   Smokeless tobacco: Never  Vaping Use   Vaping status: Former  Substance Use Topics   Alcohol use: Yes   Drug use: No     Allergies   Ibuprofen    Review of Systems Review of Systems  Constitutional:  Negative for chills and fever.  HENT:  Negative for ear pain and sore throat.   Eyes:  Negative for pain and visual disturbance.  Respiratory:  Negative for cough and shortness of breath.   Cardiovascular:  Negative for chest pain and palpitations.  Gastrointestinal:  Negative for abdominal pain and vomiting.  Genitourinary:  Negative for dysuria and hematuria.  Musculoskeletal:  Negative for arthralgias and back pain.       Right wrist, left shoulder and left elbow pain and decreased range of motion  Skin:  Negative for color change and rash.  Neurological:  Negative for seizures and syncope.  All other systems reviewed and are negative.    Physical Exam Triage Vital Signs ED Triage Vitals [04/10/24 1535]  Encounter Vitals Group     BP (!) 151/91     Systolic BP Percentile       Diastolic BP Percentile      Pulse Rate 69     Resp 18     Temp 98.6 F (37 C)     Temp Source Oral     SpO2 98 %  Weight      Height      Head Circumference      Peak Flow      Pain Score 6     Pain Loc      Pain Education      Exclude from Growth Chart    No data found.  Updated Vital Signs BP (!) 151/91 (BP Location: Right Arm)   Pulse 69   Temp 98.6 F (37 C) (Oral)   Resp 18   LMP 03/30/2024 (Exact Date)   SpO2 98%   Visual Acuity Right Eye Distance:   Left Eye Distance:   Bilateral Distance:    Right Eye Near:   Left Eye Near:    Bilateral Near:     Physical Exam Vitals and nursing note reviewed.  Constitutional:      General: She is not in acute distress.    Appearance: She is well-developed.  HENT:     Head: Normocephalic and atraumatic.  Eyes:     Conjunctiva/sclera: Conjunctivae normal.  Cardiovascular:     Rate and Rhythm: Normal rate. Rhythm irregular.     Heart sounds: No murmur heard. Pulmonary:     Effort: Pulmonary effort is normal. No respiratory distress.     Breath sounds: Normal breath sounds.  Abdominal:     Palpations: Abdomen is soft.     Tenderness: There is no abdominal tenderness.  Musculoskeletal:        General: No swelling.     Left shoulder: Tenderness (Around the [facility] joint) present. No swelling or deformity. Decreased range of motion (With abduction). Normal strength. Normal pulse.     Left elbow: No swelling or deformity. Decreased range of motion. Tenderness present in lateral epicondyle.     Right wrist: Tenderness (Along the medial to radial aspect) present. No swelling, deformity, bony tenderness or crepitus. Decreased range of motion (Mild with extension). Normal pulse.     Left wrist: Tenderness (Along the medial to radial aspect) present.     Cervical back: Neck supple.     Comments: Palpable bursa on the left lateral elbow Tenderness on the wrist is more pronounced along the thenar aspect as well as the medial and  radial aspect  Skin:    General: Skin is warm and dry.     Capillary Refill: Capillary refill takes less than 2 seconds.  Neurological:     Mental Status: She is alert.  Psychiatric:        Mood and Affect: Mood normal.      UC Treatments / Results  Labs (all labs ordered are listed, but only abnormal results are displayed) Labs Reviewed - No data to display  EKG   Radiology No results found.  Procedures Procedures (including critical care time)  Medications Ordered in UC Medications  dexamethasone  (DECADRON ) injection 10 mg (has no administration in time range)    Initial Impression / Assessment and Plan / UC Course  I have reviewed the triage vital signs and the nursing notes.  Pertinent labs & imaging results that were available during my care of the patient were reviewed by me and considered in my medical decision making (see chart for details).     Acute pain of left shoulder - Plan: DG Shoulder Left, DG Shoulder Left  Left wrist pain  Right wrist pain  Left elbow pain   X-ray done today on the left shoulder.  Final evaluation by the radiologist is still pending but on brief evaluation there is  no acute process.  There may be some aspect of arthritic changes.  Given the onset of symptoms without specific injury, this is most likely inflammation of the joints and possibly some muscle spasms from gripping the steering wheel for long periods.  There does seem to be an enlarged bursa on the lateral left elbow suggestive of bursitis which is an inflammation of the bursa.  The symptoms in the right wrist could be secondary to some mild carpal tunnel especially since the symptoms are mostly related on the thumb side and pointer and middle fingers. We can treat with the following and if symptoms do not improve then may need to follow up with orthopedics: Decadron  injection given today.  Prednisone  40 mg (2 tablets) once daily for 5 days Flexeril  5 mg every 8 hours as  needed for muscle spasms.  Use caution as this medication can cause drowsiness.  Continue to do light stretching If symptoms do not resolve within 7 to 10 days then may need to consider following up with an orthopedist. May return to urgent care as needed  Final Clinical Impressions(s) / UC Diagnoses   Final diagnoses:  Acute pain of left shoulder  Left wrist pain  Right wrist pain  Left elbow pain     Discharge Instructions      X-ray done today on the left shoulder.  Final evaluation by the radiologist is still pending but on brief evaluation there is no acute process.  There may be some aspect of arthritic changes.  Given the onset of symptoms without specific injury, this is most likely inflammation of the joints and possibly some muscle spasms from gripping the steering wheel for long periods.  There does seem to be an enlarged bursa on the outside of the left elbow suggestive of bursitis which is an inflammation of the bursa.  The symptoms in the right wrist could be secondary to some mild carpal tunnel especially since the symptoms are mostly related on the thumb side and pointer and middle fingers. We can treat with the following and if symptoms do not improve then may need to follow up with orthopedics: Decadron  injection given today. This is a steroid to help with inflammation and pain.  Start 5/28: Prednisone  40 mg (2 tablets) once daily for 5 days. Take this in the morning.  This is a steroid to help with inflammation and pain.  Flexeril  5 mg every 8 hours as needed for muscle spasms.  Use caution as this medication can cause drowsiness.  Continue to do light stretching If symptoms do not resolve within 7 to 10 days then may need to consider following up with an orthopedist. May return to urgent care as needed.   ED Prescriptions     Medication Sig Dispense Auth. Provider   predniSONE  (DELTASONE ) 20 MG tablet Take 2 tablets (40 mg total) by mouth daily with breakfast for 5  days. 10 tablet Keino Placencia A, PA-C   cyclobenzaprine  (FLEXERIL ) 5 MG tablet Take 1 tablet (5 mg total) by mouth every 8 (eight) hours as needed for muscle spasms. 30 tablet Kreg Pesa, [place]      PDMP not reviewed this encounter.   Acie Acosta 04/10/24 1655    [address] PA-C 04/10/24 1706    Kreg Pesa, PA-C 04/10/24 1706

## 2024-04-10 NOTE — Discharge Instructions (Addendum)
 X-ray done today on the left shoulder.  Final evaluation by the radiologist is still pending but on brief evaluation there is no acute process.  There may be some aspect of arthritic changes.  Given the onset of symptoms without specific injury, this is most likely inflammation of the joints and possibly some muscle spasms from gripping the steering wheel for long periods.  There does seem to be an enlarged bursa on the outside of the left elbow suggestive of bursitis which is an inflammation of the bursa.  The symptoms in the right wrist could be secondary to some mild carpal tunnel especially since the symptoms are mostly related on the thumb side and pointer and middle fingers. We can treat with the following and if symptoms do not improve then may need to follow up with orthopedics: Decadron  injection given today. This is a steroid to help with inflammation and pain.  Start 5/28: Prednisone  40 mg (2 tablets) once daily for 5 days. Take this in the morning.  This is a steroid to help with inflammation and pain.  Flexeril  5 mg every 8 hours as needed for muscle spasms.  Use caution as this medication can cause drowsiness.  Continue to do light stretching If symptoms do not resolve within 7 to 10 days then may need to consider following up with an orthopedist. May return to urgent care as needed.

## 2024-04-10 NOTE — ED Triage Notes (Signed)
 Pt c/o pain to rt wrist, lt elbow, and lt shoulder since Sunday after driving 3hrs there and back. Took aleve  at 9am with no relief. Hx of tendinitis and wearing a rt wrist brace.

## 2024-04-10 NOTE — ED Notes (Signed)
 Provided work note

## 2024-07-30 ENCOUNTER — Other Ambulatory Visit: Payer: Self-pay | Admitting: Nurse Practitioner

## 2024-07-30 DIAGNOSIS — Z1231 Encounter for screening mammogram for malignant neoplasm of breast: Secondary | ICD-10-CM
